# Patient Record
Sex: Female | Born: 1980 | Race: Black or African American | Hispanic: No | Marital: Single | Smoking: Never smoker
Health system: Southern US, Community
[De-identification: ages and names within clinical notes are randomized; demographics above are authoritative.]

## PROBLEM LIST (undated history)

## (undated) ENCOUNTER — Ambulatory Visit: Admission: EM | Payer: Medicaid Other | Source: Home / Self Care

## (undated) ENCOUNTER — Inpatient Hospital Stay (HOSPITAL_COMMUNITY): Payer: Self-pay

## (undated) ENCOUNTER — Ambulatory Visit: Admission: EM | Source: Home / Self Care

## (undated) DIAGNOSIS — F419 Anxiety disorder, unspecified: Secondary | ICD-10-CM

## (undated) DIAGNOSIS — G4700 Insomnia, unspecified: Secondary | ICD-10-CM

## (undated) DIAGNOSIS — E739 Lactose intolerance, unspecified: Secondary | ICD-10-CM

## (undated) DIAGNOSIS — K222 Esophageal obstruction: Secondary | ICD-10-CM

## (undated) DIAGNOSIS — J40 Bronchitis, not specified as acute or chronic: Secondary | ICD-10-CM

## (undated) DIAGNOSIS — D649 Anemia, unspecified: Secondary | ICD-10-CM

## (undated) DIAGNOSIS — G4730 Sleep apnea, unspecified: Secondary | ICD-10-CM

## (undated) DIAGNOSIS — T7840XA Allergy, unspecified, initial encounter: Secondary | ICD-10-CM

## (undated) DIAGNOSIS — K219 Gastro-esophageal reflux disease without esophagitis: Secondary | ICD-10-CM

## (undated) DIAGNOSIS — F32A Depression, unspecified: Secondary | ICD-10-CM

## (undated) DIAGNOSIS — R519 Headache, unspecified: Secondary | ICD-10-CM

## (undated) DIAGNOSIS — B974 Respiratory syncytial virus as the cause of diseases classified elsewhere: Secondary | ICD-10-CM

## (undated) DIAGNOSIS — K449 Diaphragmatic hernia without obstruction or gangrene: Secondary | ICD-10-CM

## (undated) DIAGNOSIS — M1990 Unspecified osteoarthritis, unspecified site: Secondary | ICD-10-CM

## (undated) DIAGNOSIS — B338 Other specified viral diseases: Secondary | ICD-10-CM

## (undated) DIAGNOSIS — R51 Headache: Secondary | ICD-10-CM

## (undated) DIAGNOSIS — J45909 Unspecified asthma, uncomplicated: Secondary | ICD-10-CM

## (undated) HISTORY — DX: Depression, unspecified: F32.A

## (undated) HISTORY — PX: TONSILLECTOMY: SUR1361

## (undated) HISTORY — PX: WISDOM TOOTH EXTRACTION: SHX21

## (undated) HISTORY — DX: Other specified viral diseases: B33.8

## (undated) HISTORY — DX: Anemia, unspecified: D64.9

## (undated) HISTORY — DX: Esophageal obstruction: K22.2

## (undated) HISTORY — DX: Sleep apnea, unspecified: G47.30

## (undated) HISTORY — DX: Respiratory syncytial virus as the cause of diseases classified elsewhere: B97.4

## (undated) HISTORY — DX: Allergy, unspecified, initial encounter: T78.40XA

## (undated) HISTORY — DX: Unspecified osteoarthritis, unspecified site: M19.90

## (undated) HISTORY — DX: Anxiety disorder, unspecified: F41.9

## (undated) HISTORY — DX: Insomnia, unspecified: G47.00

## (undated) HISTORY — DX: Diaphragmatic hernia without obstruction or gangrene: K44.9

## (undated) HISTORY — DX: Unspecified asthma, uncomplicated: J45.909

---
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
## 2001-01-12 ENCOUNTER — Encounter: Admission: RE | Admit: 2001-01-12 | Discharge: 2001-01-12 | Payer: Self-pay | Admitting: Family Medicine

## 2001-01-12 ENCOUNTER — Other Ambulatory Visit: Admission: RE | Admit: 2001-01-12 | Discharge: 2001-01-12 | Payer: Self-pay | Admitting: Family Medicine

## 2001-01-25 ENCOUNTER — Encounter: Admission: RE | Admit: 2001-01-25 | Discharge: 2001-01-25 | Payer: Self-pay | Admitting: Family Medicine

## 2001-08-19 ENCOUNTER — Encounter: Admission: RE | Admit: 2001-08-19 | Discharge: 2001-08-19 | Payer: Self-pay | Admitting: Family Medicine

## 2001-10-18 ENCOUNTER — Encounter: Admission: RE | Admit: 2001-10-18 | Discharge: 2001-10-18 | Payer: Self-pay | Admitting: Family Medicine

## 2001-10-28 ENCOUNTER — Encounter: Admission: RE | Admit: 2001-10-28 | Discharge: 2001-10-28 | Payer: Self-pay | Admitting: Family Medicine

## 2001-11-18 ENCOUNTER — Encounter: Admission: RE | Admit: 2001-11-18 | Discharge: 2001-11-18 | Payer: Self-pay | Admitting: Family Medicine

## 2002-05-31 ENCOUNTER — Other Ambulatory Visit: Admission: RE | Admit: 2002-05-31 | Discharge: 2002-05-31 | Payer: Self-pay | Admitting: Family Medicine

## 2002-05-31 ENCOUNTER — Encounter: Admission: RE | Admit: 2002-05-31 | Discharge: 2002-05-31 | Payer: Self-pay | Admitting: Family Medicine

## 2002-06-02 ENCOUNTER — Encounter: Admission: RE | Admit: 2002-06-02 | Discharge: 2002-06-02 | Payer: Self-pay | Admitting: Family Medicine

## 2002-06-10 ENCOUNTER — Encounter: Admission: RE | Admit: 2002-06-10 | Discharge: 2002-06-10 | Payer: Self-pay | Admitting: Family Medicine

## 2002-07-27 ENCOUNTER — Encounter: Admission: RE | Admit: 2002-07-27 | Discharge: 2002-07-27 | Payer: Self-pay | Admitting: Family Medicine

## 2002-11-05 ENCOUNTER — Inpatient Hospital Stay (HOSPITAL_COMMUNITY): Admission: AD | Admit: 2002-11-05 | Discharge: 2002-11-05 | Payer: Self-pay | Admitting: *Deleted

## 2003-01-01 ENCOUNTER — Emergency Department (HOSPITAL_COMMUNITY): Admission: EM | Admit: 2003-01-01 | Discharge: 2003-01-01 | Payer: Self-pay | Admitting: Emergency Medicine

## 2003-05-04 ENCOUNTER — Emergency Department (HOSPITAL_COMMUNITY): Admission: EM | Admit: 2003-05-04 | Discharge: 2003-05-04 | Payer: Self-pay | Admitting: Emergency Medicine

## 2003-06-04 ENCOUNTER — Encounter (INDEPENDENT_AMBULATORY_CARE_PROVIDER_SITE_OTHER): Payer: Self-pay | Admitting: *Deleted

## 2003-06-18 ENCOUNTER — Inpatient Hospital Stay (HOSPITAL_COMMUNITY): Admission: AD | Admit: 2003-06-18 | Discharge: 2003-06-19 | Payer: Self-pay | Admitting: Family Medicine

## 2003-06-19 ENCOUNTER — Encounter: Payer: Self-pay | Admitting: Family Medicine

## 2003-06-22 ENCOUNTER — Other Ambulatory Visit: Admission: RE | Admit: 2003-06-22 | Discharge: 2003-06-22 | Payer: Self-pay | Admitting: Family Medicine

## 2003-06-22 ENCOUNTER — Encounter: Admission: RE | Admit: 2003-06-22 | Discharge: 2003-06-22 | Payer: Self-pay | Admitting: Family Medicine

## 2003-07-05 ENCOUNTER — Encounter: Payer: Self-pay | Admitting: Family Medicine

## 2003-07-05 ENCOUNTER — Ambulatory Visit (HOSPITAL_COMMUNITY): Admission: RE | Admit: 2003-07-05 | Discharge: 2003-07-05 | Payer: Self-pay | Admitting: Family Medicine

## 2003-07-25 ENCOUNTER — Encounter: Admission: RE | Admit: 2003-07-25 | Discharge: 2003-07-25 | Payer: Self-pay | Admitting: Family Medicine

## 2003-08-29 ENCOUNTER — Encounter: Admission: RE | Admit: 2003-08-29 | Discharge: 2003-08-29 | Payer: Self-pay | Admitting: Family Medicine

## 2003-09-02 ENCOUNTER — Inpatient Hospital Stay (HOSPITAL_COMMUNITY): Admission: AD | Admit: 2003-09-02 | Discharge: 2003-09-03 | Payer: Self-pay | Admitting: Obstetrics and Gynecology

## 2003-09-09 ENCOUNTER — Inpatient Hospital Stay (HOSPITAL_COMMUNITY): Admission: AD | Admit: 2003-09-09 | Discharge: 2003-09-09 | Payer: Self-pay | Admitting: Family Medicine

## 2003-09-22 ENCOUNTER — Ambulatory Visit (HOSPITAL_COMMUNITY): Admission: RE | Admit: 2003-09-22 | Discharge: 2003-09-22 | Payer: Self-pay | Admitting: Family Medicine

## 2003-09-27 ENCOUNTER — Encounter: Admission: RE | Admit: 2003-09-27 | Discharge: 2003-09-27 | Payer: Self-pay | Admitting: Family Medicine

## 2003-10-30 ENCOUNTER — Encounter: Admission: RE | Admit: 2003-10-30 | Discharge: 2003-10-30 | Payer: Self-pay | Admitting: Family Medicine

## 2003-11-13 ENCOUNTER — Inpatient Hospital Stay (HOSPITAL_COMMUNITY): Admission: AD | Admit: 2003-11-13 | Discharge: 2003-11-13 | Payer: Self-pay | Admitting: Family Medicine

## 2003-11-29 ENCOUNTER — Encounter: Admission: RE | Admit: 2003-11-29 | Discharge: 2003-11-29 | Payer: Self-pay | Admitting: Family Medicine

## 2003-12-01 ENCOUNTER — Encounter: Admission: RE | Admit: 2003-12-01 | Discharge: 2003-12-01 | Payer: Self-pay | Admitting: Sports Medicine

## 2003-12-08 ENCOUNTER — Encounter: Admission: RE | Admit: 2003-12-08 | Discharge: 2003-12-08 | Payer: Self-pay | Admitting: Family Medicine

## 2003-12-14 ENCOUNTER — Encounter: Admission: RE | Admit: 2003-12-14 | Discharge: 2003-12-14 | Payer: Self-pay | Admitting: Sports Medicine

## 2003-12-28 ENCOUNTER — Encounter: Admission: RE | Admit: 2003-12-28 | Discharge: 2003-12-28 | Payer: Self-pay | Admitting: Family Medicine

## 2003-12-30 ENCOUNTER — Inpatient Hospital Stay (HOSPITAL_COMMUNITY): Admission: AD | Admit: 2003-12-30 | Discharge: 2003-12-30 | Payer: Self-pay | Admitting: *Deleted

## 2004-01-12 ENCOUNTER — Encounter: Admission: RE | Admit: 2004-01-12 | Discharge: 2004-01-12 | Payer: Self-pay | Admitting: Family Medicine

## 2004-01-16 ENCOUNTER — Encounter: Admission: RE | Admit: 2004-01-16 | Discharge: 2004-01-16 | Payer: Self-pay | Admitting: Family Medicine

## 2004-01-24 ENCOUNTER — Encounter: Admission: RE | Admit: 2004-01-24 | Discharge: 2004-01-24 | Payer: Self-pay | Admitting: Family Medicine

## 2004-01-31 ENCOUNTER — Encounter: Admission: RE | Admit: 2004-01-31 | Discharge: 2004-01-31 | Payer: Self-pay | Admitting: Family Medicine

## 2004-02-07 ENCOUNTER — Inpatient Hospital Stay (HOSPITAL_COMMUNITY): Admission: AD | Admit: 2004-02-07 | Discharge: 2004-02-09 | Payer: Self-pay | Admitting: Family Medicine

## 2004-02-15 ENCOUNTER — Encounter: Admission: RE | Admit: 2004-02-15 | Discharge: 2004-02-15 | Payer: Self-pay | Admitting: Family Medicine

## 2004-03-21 ENCOUNTER — Encounter: Admission: RE | Admit: 2004-03-21 | Discharge: 2004-03-21 | Payer: Self-pay | Admitting: Sports Medicine

## 2004-05-15 ENCOUNTER — Encounter: Admission: RE | Admit: 2004-05-15 | Discharge: 2004-05-15 | Payer: Self-pay | Admitting: Family Medicine

## 2004-05-19 ENCOUNTER — Emergency Department (HOSPITAL_COMMUNITY): Admission: EM | Admit: 2004-05-19 | Discharge: 2004-05-19 | Payer: Self-pay | Admitting: *Deleted

## 2004-08-22 ENCOUNTER — Inpatient Hospital Stay (HOSPITAL_COMMUNITY): Admission: AD | Admit: 2004-08-22 | Discharge: 2004-08-22 | Payer: Self-pay | Admitting: Obstetrics & Gynecology

## 2004-09-04 ENCOUNTER — Ambulatory Visit: Payer: Self-pay | Admitting: Family Medicine

## 2004-09-04 ENCOUNTER — Other Ambulatory Visit: Admission: RE | Admit: 2004-09-04 | Discharge: 2004-09-04 | Payer: Self-pay | Admitting: Family Medicine

## 2004-09-11 ENCOUNTER — Ambulatory Visit: Payer: Self-pay | Admitting: Family Medicine

## 2004-10-29 ENCOUNTER — Ambulatory Visit: Payer: Self-pay | Admitting: Family Medicine

## 2004-11-01 ENCOUNTER — Encounter: Admission: RE | Admit: 2004-11-01 | Discharge: 2004-11-28 | Payer: Self-pay | Admitting: Family Medicine

## 2005-01-03 ENCOUNTER — Emergency Department (HOSPITAL_COMMUNITY): Admission: EM | Admit: 2005-01-03 | Discharge: 2005-01-03 | Payer: Self-pay | Admitting: Family Medicine

## 2005-01-29 ENCOUNTER — Ambulatory Visit: Payer: Self-pay | Admitting: Family Medicine

## 2005-03-04 ENCOUNTER — Ambulatory Visit: Payer: Self-pay | Admitting: Family Medicine

## 2005-03-19 ENCOUNTER — Ambulatory Visit: Payer: Self-pay | Admitting: Family Medicine

## 2005-05-14 ENCOUNTER — Ambulatory Visit: Payer: Self-pay | Admitting: Family Medicine

## 2005-05-28 ENCOUNTER — Ambulatory Visit: Payer: Self-pay | Admitting: Family Medicine

## 2005-05-29 ENCOUNTER — Emergency Department (HOSPITAL_COMMUNITY): Admission: EM | Admit: 2005-05-29 | Discharge: 2005-05-30 | Payer: Self-pay | Admitting: Emergency Medicine

## 2005-09-03 ENCOUNTER — Ambulatory Visit: Payer: Self-pay | Admitting: Family Medicine

## 2005-10-02 ENCOUNTER — Ambulatory Visit (HOSPITAL_BASED_OUTPATIENT_CLINIC_OR_DEPARTMENT_OTHER): Admission: RE | Admit: 2005-10-02 | Discharge: 2005-10-03 | Payer: Self-pay | Admitting: Otolaryngology

## 2005-10-02 ENCOUNTER — Ambulatory Visit (HOSPITAL_COMMUNITY): Admission: RE | Admit: 2005-10-02 | Discharge: 2005-10-02 | Payer: Self-pay | Admitting: Otolaryngology

## 2005-10-02 ENCOUNTER — Encounter (INDEPENDENT_AMBULATORY_CARE_PROVIDER_SITE_OTHER): Payer: Self-pay | Admitting: Specialist

## 2005-10-16 ENCOUNTER — Ambulatory Visit: Payer: Self-pay | Admitting: Sports Medicine

## 2005-10-26 ENCOUNTER — Emergency Department (HOSPITAL_COMMUNITY): Admission: EM | Admit: 2005-10-26 | Discharge: 2005-10-26 | Payer: Self-pay | Admitting: Emergency Medicine

## 2005-10-31 ENCOUNTER — Ambulatory Visit: Payer: Self-pay | Admitting: Sports Medicine

## 2005-11-13 ENCOUNTER — Ambulatory Visit: Payer: Self-pay | Admitting: Family Medicine

## 2006-04-03 ENCOUNTER — Ambulatory Visit: Payer: Self-pay | Admitting: Family Medicine

## 2006-04-06 ENCOUNTER — Other Ambulatory Visit: Admission: RE | Admit: 2006-04-06 | Discharge: 2006-04-06 | Payer: Self-pay | Admitting: Family Medicine

## 2006-04-06 ENCOUNTER — Ambulatory Visit: Payer: Self-pay | Admitting: Family Medicine

## 2006-04-13 ENCOUNTER — Ambulatory Visit: Payer: Self-pay | Admitting: Family Medicine

## 2006-06-15 ENCOUNTER — Ambulatory Visit: Payer: Self-pay | Admitting: Family Medicine

## 2006-07-15 ENCOUNTER — Ambulatory Visit: Payer: Self-pay | Admitting: Sports Medicine

## 2006-08-05 ENCOUNTER — Emergency Department (HOSPITAL_COMMUNITY): Admission: EM | Admit: 2006-08-05 | Discharge: 2006-08-05 | Payer: Self-pay | Admitting: Family Medicine

## 2006-08-19 ENCOUNTER — Ambulatory Visit: Payer: Self-pay | Admitting: Family Medicine

## 2006-09-17 ENCOUNTER — Ambulatory Visit: Payer: Self-pay | Admitting: Sports Medicine

## 2006-12-31 DIAGNOSIS — K219 Gastro-esophageal reflux disease without esophagitis: Secondary | ICD-10-CM | POA: Insufficient documentation

## 2006-12-31 DIAGNOSIS — J309 Allergic rhinitis, unspecified: Secondary | ICD-10-CM | POA: Insufficient documentation

## 2006-12-31 DIAGNOSIS — G43909 Migraine, unspecified, not intractable, without status migrainosus: Secondary | ICD-10-CM | POA: Insufficient documentation

## 2006-12-31 DIAGNOSIS — E669 Obesity, unspecified: Secondary | ICD-10-CM | POA: Insufficient documentation

## 2007-01-01 ENCOUNTER — Encounter (INDEPENDENT_AMBULATORY_CARE_PROVIDER_SITE_OTHER): Payer: Self-pay | Admitting: *Deleted

## 2007-02-20 ENCOUNTER — Emergency Department (HOSPITAL_COMMUNITY): Admission: EM | Admit: 2007-02-20 | Discharge: 2007-02-20 | Payer: Self-pay | Admitting: Emergency Medicine

## 2008-06-23 ENCOUNTER — Encounter: Payer: Self-pay | Admitting: *Deleted

## 2008-06-30 ENCOUNTER — Encounter: Payer: Self-pay | Admitting: *Deleted

## 2008-11-21 ENCOUNTER — Emergency Department (HOSPITAL_COMMUNITY): Admission: EM | Admit: 2008-11-21 | Discharge: 2008-11-21 | Payer: Self-pay | Admitting: Family Medicine

## 2009-01-29 ENCOUNTER — Emergency Department (HOSPITAL_COMMUNITY): Admission: EM | Admit: 2009-01-29 | Discharge: 2009-01-29 | Payer: Self-pay | Admitting: Family Medicine

## 2009-01-29 ENCOUNTER — Encounter: Admission: RE | Admit: 2009-01-29 | Discharge: 2009-01-29 | Payer: Self-pay | Admitting: Internal Medicine

## 2009-07-21 ENCOUNTER — Emergency Department (HOSPITAL_COMMUNITY): Admission: EM | Admit: 2009-07-21 | Discharge: 2009-07-21 | Payer: Self-pay | Admitting: Emergency Medicine

## 2009-10-21 ENCOUNTER — Emergency Department (HOSPITAL_COMMUNITY): Admission: EM | Admit: 2009-10-21 | Discharge: 2009-10-22 | Payer: Self-pay | Admitting: Emergency Medicine

## 2011-03-21 NOTE — Op Note (Signed)
[name], [name] NO.:  [id number]   MEDICAL RECORD NO.:  [id number]            PATIENT TYPE:   LOCATION:                                 FACILITY:   PHYSICIAN:  Suzanna Obey, M.D.            DATE OF BIRTH:   DATE OF PROCEDURE:  10/02/2005  DATE OF DISCHARGE:                                 OPERATIVE REPORT   PREOPERATIVE DIAGNOSIS:  Chronic tonsillitis.   POSTOPERATIVE DIAGNOSIS:  Chronic tonsillitis.   OPERATION/PROCEDURE:  Tonsillectomy.   ANESTHESIA:  General endotracheal anesthesia.   ESTIMATED BLOOD LOSS:  Less than 5 cc.   INDICATIONS:  This is a 30 year old who has had chronic and recurrent  episodes of tonsillitis with cryptic debris.  The patient is now ready to  proceed as she has failed medical therapy.  He was informed of the risk and  benefits of the procedure including bleeding, infection, palatopharyngeal  insufficiency, change in the voice, chronic pain, and risks of the  anesthetic.  All questions were answered and consent was obtained.   DESCRIPTION OF PROCEDURE:  The patient was taken to the operating room and  placed in the supine position after adequate general endotracheal tube  anesthesia, was placed in the Rose position, draped in the usual sterile  manner.  Crowe-Davis mouth gag was inserted and retracted and suspended from  the [employer] stand.  Left tonsil began by making a left anterior tonsillar  pillar incision, identifying the capsule of the tonsil and removing it with  electrocautery dissection.  The right tonsil was removed in the same  fashion.  The tonsils were then cauterized with suction cautery to gain good  hemostasis.  The adenoids were examined. There was no significant adenoid  tissue.  The hypopharynx, esophagus, and stomach were suctioned of the NG  tube. The Crowe-Davis was released and resuspended.  There was hemostasis  present.  The patient was awakened and brought to the recovery room in  stable condition.   Counts correct.           ______________________________  Suzanna Obey, M.D.     JB/MEDQ  D:  10/02/2005  T:  10/02/2005  Job:  [id number]

## 2011-08-08 ENCOUNTER — Emergency Department (HOSPITAL_BASED_OUTPATIENT_CLINIC_OR_DEPARTMENT_OTHER)
Admission: EM | Admit: 2011-08-08 | Discharge: 2011-08-09 | Disposition: A | Discharge status: Home / Self Care | Payer: Medicaid Other | Attending: Emergency Medicine | Admitting: Emergency Medicine

## 2011-08-08 DIAGNOSIS — K219 Gastro-esophageal reflux disease without esophagitis: Secondary | ICD-10-CM | POA: Insufficient documentation

## 2011-08-08 DIAGNOSIS — Z79899 Other long term (current) drug therapy: Secondary | ICD-10-CM | POA: Insufficient documentation

## 2011-08-08 DIAGNOSIS — G43909 Migraine, unspecified, not intractable, without status migrainosus: Secondary | ICD-10-CM | POA: Insufficient documentation

## 2011-08-08 HISTORY — DX: Gastro-esophageal reflux disease without esophagitis: K21.9

## 2011-08-08 MED ORDER — METOCLOPRAMIDE HCL 5 MG/ML IJ SOLN
10.0000 mg | Freq: Once | INTRAMUSCULAR | Status: AC
  Administered 2011-08-08: 10 mg via INTRAVENOUS
  Filled 2011-08-08: qty 2

## 2011-08-08 MED ORDER — ONDANSETRON HCL 4 MG/2ML IJ SOLN
INTRAMUSCULAR | Status: AC
  Filled 2011-08-08: qty 2

## 2011-08-08 MED ORDER — KETOROLAC TROMETHAMINE 30 MG/ML IJ SOLN
30.0000 mg | Freq: Once | INTRAMUSCULAR | Status: AC
  Administered 2011-08-08: 30 mg via INTRAVENOUS
  Filled 2011-08-08: qty 1

## 2011-08-08 MED ORDER — SODIUM CHLORIDE 0.9 % IV BOLUS (SEPSIS)
1000.0000 mL | Freq: Once | INTRAVENOUS | Status: AC
  Administered 2011-08-08: 1000 mL via INTRAVENOUS

## 2011-08-08 MED ORDER — DIPHENHYDRAMINE HCL 50 MG/ML IJ SOLN
25.0000 mg | Freq: Once | INTRAMUSCULAR | Status: AC
  Administered 2011-08-08: 25 mg via INTRAVENOUS
  Filled 2011-08-08: qty 1

## 2011-08-08 MED ORDER — ONDANSETRON HCL 4 MG/2ML IJ SOLN
4.0000 mg | Freq: Once | INTRAMUSCULAR | Status: AC
  Administered 2011-08-08: 4 mg via INTRAVENOUS

## 2011-08-08 NOTE — ED Provider Notes (Signed)
History     CSN: [id number] Arrival date & time: 08/08/2011 10:35 PM  Chief Complaint  Patient presents with  . Migraine    (Consider location/radiation/quality/duration/timing/severity/associated sxs/prior treatment) HPI Comments: Patient is a 30 year old female with history of migraine disorder. She states that her headache, gradually in the afternoon it gradually got worse. It is constant, frontal, throbbing in nature. She has associated nausea and photophobia. She states this feels similar to her prior headaches. She denies focal neurologic deficits including weakness, numbness, visual change.  Patient is a 30 y.o. female presenting with migraine. The history is provided by the patient and a relative.  Migraine    Past Medical History  Diagnosis Date  . Migraine   . GERD (gastroesophageal reflux disease)     Past Surgical History  Procedure Date  . Tonsillectomy   . Wisdom tooth extraction     No family history on file.  History  Substance Use Topics  . Smoking status: Never Smoker   . Smokeless tobacco: Not on file  . Alcohol Use: Yes    OB History    Grav Para Term Preterm Abortions TAB SAB Ect Mult Living                  Review of Systems  All other systems reviewed and are negative.    Allergies  Amoxil  Home Medications   Current Outpatient Rx  Name Route Sig Dispense Refill  . ALBUTEROL SULFATE HFA 108 (90 BASE) MCG/ACT IN AERS Inhalation Inhale 2 puffs into the lungs every 6 (six) hours as needed. For shortness of breath and wheezing     . CETIRIZINE HCL 10 MG PO TABS Oral Take 10 mg by mouth daily.      . CYCLOBENZAPRINE HCL PO Oral Take by mouth.      . ETONOGESTREL-ETHINYL ESTRADIOL 0.12-0.015 MG/24HR VA RING Vaginal Place 1 each vaginally every 28 (twenty-eight) days. Insert vaginally and leave in place for 3 consecutive weeks, then remove for 1 week.     Marland Kitchen FLUTICASONE PROPIONATE 50 MCG/ACT NA SUSP Nasal Place 2 sprays into the nose daily  as needed. Seasonal allergies      . ADVAIR HFA IN Inhalation Inhale into the lungs.      . IBUPROFEN 800 MG PO TABS Oral Take 800 mg by mouth every 8 (eight) hours as needed. For pain    . PRILOSEC PO Oral Take by mouth.      Marland Kitchen ZOLOFT PO Oral Take by mouth.      . SUMATRIPTAN SUCCINATE 25 MG PO TABS Oral Take 25 mg by mouth every 2 (two) hours as needed. For migraine    . TRAMADOL HCL PO Oral Take by mouth.        BP 129/81  Pulse 85  Temp(Src) 98.4 F (36.9 C) (Oral)  Resp 20  Ht 5' 1.75" (1.568 m)  Wt 227 lb (102.967 kg)  BMI 41.86 kg/m2  SpO2 100%  LMP 07/09/2011  Physical Exam  Nursing note and vitals reviewed. Constitutional: She appears well-developed and well-nourished. No distress.  HENT:  Head: Normocephalic and atraumatic.  Mouth/Throat: Oropharynx is clear and moist. No oropharyngeal exudate.  Eyes: Conjunctivae and EOM are normal. Pupils are equal, round, and reactive to light. Right eye exhibits no discharge. Left eye exhibits no discharge. No scleral icterus.  Neck: Normal range of motion. Neck supple. No JVD present. No thyromegaly present.  Cardiovascular: Normal rate, regular rhythm, normal heart sounds and intact distal pulses.  Exam reveals no gallop and no friction rub.   No murmur heard. Pulmonary/Chest: Effort normal and breath sounds normal. No respiratory distress. She has no wheezes. She has no rales.  Abdominal: Soft. Bowel sounds are normal. She exhibits no distension and no mass. There is no tenderness.  Musculoskeletal: Normal range of motion. She exhibits no edema and no tenderness.  Lymphadenopathy:    She has no cervical adenopathy.  Neurological: She is alert. Coordination normal.       Speech normal, pupillary exam normal, alert and oriented, gait normal, follows all commands. Normal muscle tone sensations to the upper lower extremity  Skin: Skin is warm and dry. No rash noted. No erythema.  Psychiatric: She has a normal mood and affect. Her  behavior is normal.    ED Course  Procedures (including critical care time)  Labs Reviewed - No data to display No results found.   No diagnosis found.    MDM  Examined history consistent with her typical migraine. I be placed by nursing staff, medications ordered. Will reevaluate her no signs of meningitis including fever or stiff neck, normal mental status no focal neurologic deficits with a headache that feels like her normal headache.  After medications given through intravenous access, patient improved to have a 0/10 pain. We'll discharge home      Vida Roller, MD 08/09/11 [phone]

## 2011-08-08 NOTE — ED Notes (Signed)
C/o migraine,nausea

## 2011-08-09 MED ORDER — METOCLOPRAMIDE HCL 10 MG PO TABS: 10.0000 mg | ORAL_TABLET | Freq: Four times a day (QID) | ORAL | Status: AC

## 2012-05-19 ENCOUNTER — Emergency Department (HOSPITAL_COMMUNITY)
Admission: EM | Admit: 2012-05-19 | Discharge: 2012-05-19 | Disposition: A | Discharge status: Home / Self Care | Payer: Medicaid Other | Attending: Emergency Medicine | Admitting: Emergency Medicine

## 2012-05-19 ENCOUNTER — Encounter (HOSPITAL_COMMUNITY): Payer: Self-pay | Admitting: *Deleted

## 2012-05-19 DIAGNOSIS — G43909 Migraine, unspecified, not intractable, without status migrainosus: Secondary | ICD-10-CM | POA: Insufficient documentation

## 2012-05-19 DIAGNOSIS — H109 Unspecified conjunctivitis: Secondary | ICD-10-CM

## 2012-05-19 DIAGNOSIS — K219 Gastro-esophageal reflux disease without esophagitis: Secondary | ICD-10-CM | POA: Insufficient documentation

## 2012-05-19 MED ORDER — TETRACAINE HCL 0.5 % OP SOLN
2.0000 [drp] | Freq: Once | OPHTHALMIC | Status: DC
  Filled 2012-05-19: qty 2

## 2012-05-19 MED ORDER — ERYTHROMYCIN 5 MG/GM OP OINT: TOPICAL_OINTMENT | Freq: Four times a day (QID) | OPHTHALMIC | Status: AC

## 2012-05-19 MED ORDER — ERYTHROMYCIN 5 MG/GM OP OINT
TOPICAL_OINTMENT | Freq: Once | OPHTHALMIC | Status: DC
  Filled 2012-05-19: qty 1

## 2012-05-19 MED ORDER — FLUORESCEIN SODIUM 1 MG OP STRP
1.0000 | ORAL_STRIP | Freq: Once | OPHTHALMIC | Status: DC
  Filled 2012-05-19: qty 2

## 2012-05-19 NOTE — ED Notes (Signed)
Red eye irritation

## 2012-05-19 NOTE — ED Provider Notes (Signed)
History     CSN: [id number]  Arrival date & time 05/19/12  2129   None     Chief Complaint  Patient presents with  . Conjunctivitis    (Consider location/radiation/quality/duration/timing/severity/associated sxs/prior treatment) HPI Comments: Patient noticed yesterday that she had some slight I., redness, and irritation with swelling to the upper lid.  This morning.  She woke and this was worse.  I has been tearing throughout the day.  She does not wear contact lenses.  She does not remember hitting anything in her eye.  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  She noticed since her eye has been red, but she also has a slight sore throat.  On the same side.  She has been taking ibuprofen with minimal relief.  She did call her primary care physician, but they were unable to see her today  Patient is a 31 y.o. female presenting with conjunctivitis. The history is provided by the patient.  Conjunctivitis  The current episode started yesterday. The problem occurs continuously. The problem has been unchanged. The problem is moderate. Nothing relieves the symptoms. Nothing aggravates the symptoms. Associated symptoms include sore throat, eye discharge, eye pain and eye redness. Pertinent negatives include no fever, no decreased vision, no double vision, no eye itching, no photophobia, no ear discharge, no ear pain, no hearing loss and no rhinorrhea.    Past Medical History  Diagnosis Date  . Migraine   . GERD (gastroesophageal reflux disease)     Past Surgical History  Procedure Date  . Tonsillectomy   . Wisdom tooth extraction     History reviewed. No pertinent family history.  History  Substance Use Topics  . Smoking status: Never Smoker   . Smokeless tobacco: Not on file  . Alcohol Use: Yes    OB History    Grav Para Term Preterm Abortions TAB SAB Ect Mult Living                  Review of Systems  Constitutional: Negative for fever.   HENT: Positive for sore throat. Negative for hearing loss, ear pain, rhinorrhea and ear discharge.   Eyes: Positive for pain, discharge and redness. Negative for double vision, photophobia, itching and visual disturbance.  Neurological: Negative for dizziness and weakness.    Allergies  Amoxicillin  Home Medications   Current Outpatient Rx  Name Route Sig Dispense Refill  . ALBUTEROL SULFATE HFA 108 (90 BASE) MCG/ACT IN AERS Inhalation Inhale 2 puffs into the lungs every 6 (six) hours as needed. For shortness of breath and wheezing     . CETIRIZINE HCL 10 MG PO TABS Oral Take 10 mg by mouth daily.      . CYCLOBENZAPRINE HCL 10 MG PO TABS Oral Take 10 mg by mouth 3 (three) times daily as needed. For pain    . ETONOGESTREL-ETHINYL ESTRADIOL 0.12-0.015 MG/24HR VA RING Vaginal Place 1 each vaginally every 28 (twenty-eight) days. Insert vaginally and leave in place for 3 consecutive weeks, then remove for 1 week.     Marland Kitchen FLUTICASONE PROPIONATE 50 MCG/ACT NA SUSP Nasal Place 2 sprays into the nose daily as needed. Seasonal allergies      . FLUTICASONE-SALMETEROL 250-50 MCG/DOSE IN AEPB Inhalation Inhale 1 puff into the lungs every 12 (twelve) hours.    . IBUPROFEN 800 MG PO TABS Oral Take 800 mg by mouth every 8 (eight) hours as needed. For pain    .  NYSTATIN 100000 UNIT/GM EX OINT Topical Apply 1 application topically 3 (three) times daily.    Marland Kitchen PANTOPRAZOLE SODIUM 40 MG PO TBEC Oral Take 40 mg by mouth daily.    Marland Kitchen ZOLOFT PO Oral Take 50 mg by mouth daily.       BP 129/80  Pulse 93  Temp 99 F (37.2 C) (Oral)  Resp 20  SpO2 99%  Physical Exam  Constitutional: She appears well-developed and well-nourished.  HENT:  Head: Normocephalic.  Mouth/Throat: Uvula is midline. No oropharyngeal exudate, posterior oropharyngeal erythema or tonsillar abscesses.  Eyes: Pupils are equal, round, and reactive to light. Right eye exhibits discharge. Right conjunctiva is injected. Right conjunctiva has  no hemorrhage. Right eye exhibits normal extraocular motion and no nystagmus.  Slit lamp exam:      The right eye shows no fluorescein uptake and no anterior chamber bulge.       Some light swelling of the upper lid, lateral aspect greater than medial    ED Course  Procedures (including critical care time)  Labs Reviewed - No data to display No results found.   1. Conjunctivitis       MDM   There was no fluorescein uptake.  On examination, I will treat this patient for conjunctivitis with erythromycin ointment 4 times a day.  Warm soaks, and followup with ophthalmologist in 2 days        Arman Filter, NP 05/19/12 2303  Arman Filter, NP 05/19/12 2303

## 2012-05-20 NOTE — ED Provider Notes (Signed)
Medical screening examination/treatment/procedure(s) were performed by non-physician practitioner and as supervising physician I was immediately available for consultation/collaboration.   Kavin Weckwerth, MD 05/20/12 0007 

## 2012-08-11 ENCOUNTER — Encounter (HOSPITAL_BASED_OUTPATIENT_CLINIC_OR_DEPARTMENT_OTHER): Payer: Self-pay | Admitting: *Deleted

## 2012-08-11 ENCOUNTER — Emergency Department (HOSPITAL_BASED_OUTPATIENT_CLINIC_OR_DEPARTMENT_OTHER)
Admission: EM | Admit: 2012-08-11 | Discharge: 2012-08-11 | Disposition: A | Discharge status: Home / Self Care | Payer: Self-pay | Attending: Emergency Medicine | Admitting: Emergency Medicine

## 2012-08-11 DIAGNOSIS — K219 Gastro-esophageal reflux disease without esophagitis: Secondary | ICD-10-CM | POA: Insufficient documentation

## 2012-08-11 DIAGNOSIS — G43909 Migraine, unspecified, not intractable, without status migrainosus: Secondary | ICD-10-CM | POA: Insufficient documentation

## 2012-08-11 DIAGNOSIS — Z881 Allergy status to other antibiotic agents status: Secondary | ICD-10-CM | POA: Insufficient documentation

## 2012-08-11 DIAGNOSIS — N644 Mastodynia: Secondary | ICD-10-CM | POA: Insufficient documentation

## 2012-08-11 MED ORDER — DICLOFENAC SODIUM 75 MG PO TBEC: 75.0000 mg | DELAYED_RELEASE_TABLET | Freq: Two times a day (BID) | ORAL | Status: DC

## 2012-08-11 NOTE — ED Provider Notes (Signed)
Medical screening examination/treatment/procedure(s) were performed by non-physician practitioner and as supervising physician I was immediately available for consultation/collaboration.    Nelia Shi, MD 08/11/12 [phone]

## 2012-08-11 NOTE — ED Provider Notes (Signed)
History     CSN: [id number]  Arrival date & time 08/11/12  1432   First MD Initiated Contact with Patient 08/11/12 1520      Chief Complaint  Patient presents with  . Breast Pain    (Consider location/radiation/quality/duration/timing/severity/associated sxs/prior treatment) Patient is a 31 y.o. female presenting with chest pain. The history is provided by the patient. No language interpreter was used.  Chest Pain The chest pain began 1 - 2 weeks ago. Chest pain occurs constantly. The chest pain is unchanged.   Pt complains of a sore area to left breast.   No injury.   Pt has a skin bump in the same area.  Pt reports she thinks she felt a lump in the area  Past Medical History  Diagnosis Date  . Migraine   . GERD (gastroesophageal reflux disease)     Past Surgical History  Procedure Date  . Tonsillectomy   . Wisdom tooth extraction     History reviewed. No pertinent family history.  History  Substance Use Topics  . Smoking status: Never Smoker   . Smokeless tobacco: Not on file  . Alcohol Use: Yes    OB History    Grav Para Term Preterm Abortions TAB SAB Ect Mult Living                  Review of Systems  Cardiovascular: Positive for chest pain.  All other systems reviewed and are negative.    Allergies  Amoxicillin  Home Medications   Current Outpatient Rx  Name Route Sig Dispense Refill  . ALBUTEROL SULFATE HFA 108 (90 BASE) MCG/ACT IN AERS Inhalation Inhale 2 puffs into the lungs every 6 (six) hours as needed. For shortness of breath and wheezing     . CETIRIZINE HCL 10 MG PO TABS Oral Take 10 mg by mouth daily.      . CYCLOBENZAPRINE HCL 10 MG PO TABS Oral Take 10 mg by mouth 3 (three) times daily as needed. For pain    . DICLOFENAC SODIUM 75 MG PO TBEC Oral Take 1 tablet (75 mg total) by mouth 2 (two) times daily. 14 tablet 0  . ETONOGESTREL-ETHINYL ESTRADIOL 0.12-0.015 MG/24HR VA RING Vaginal Place 1 each vaginally every 28 (twenty-eight) days.  Insert vaginally and leave in place for 3 consecutive weeks, then remove for 1 week.     Marland Kitchen FLUTICASONE PROPIONATE 50 MCG/ACT NA SUSP Nasal Place 2 sprays into the nose daily as needed. Seasonal allergies      . FLUTICASONE-SALMETEROL 250-50 MCG/DOSE IN AEPB Inhalation Inhale 1 puff into the lungs every 12 (twelve) hours.    . IBUPROFEN 800 MG PO TABS Oral Take 800 mg by mouth every 8 (eight) hours as needed. For pain    . NYSTATIN 100000 UNIT/GM EX OINT Topical Apply 1 application topically 3 (three) times daily.    Marland Kitchen PANTOPRAZOLE SODIUM 40 MG PO TBEC Oral Take 40 mg by mouth daily.    Marland Kitchen ZOLOFT PO Oral Take 50 mg by mouth daily.       BP 123/77  Pulse 81  Temp 98 F (36.7 C) (Oral)  Resp 18  Ht 5' 1.75" (1.568 m)  Wt 255 lb 5 oz (115.809 kg)  BMI 47.08 kg/m2  SpO2 100%  LMP 08/03/2012  Physical Exam  Nursing note and vitals reviewed. Constitutional: She appears well-developed and well-nourished.  HENT:  Head: Normocephalic.  Cardiovascular: Normal rate.   Pulmonary/Chest: Effort normal.  Musculoskeletal: Normal range of motion.  I do not palpate any mass or swollen area.   Pt does have tenderness to palpation  Neurological: She is alert.  Skin: Skin is warm.  Psychiatric: She has a normal mood and affect.    ED Course  Procedures (including critical care time)  Labs Reviewed - No data to display No results found.   1. Soreness breast       MDM  I advised pt that I could put her on voltaren for soreness.  Pt advised to follow up at the breast center for ultrasound of area.          Lonia Skinner [place], [place] 08/11/12 1622

## 2012-08-11 NOTE — ED Notes (Signed)
Pt amb to triage with quick steady gait smiling in nad, reports one week of left breast pain and soreness. Tender to palp, increases with mvt of left arm, and positioning. Pt states she has a "bump" that her pcp looked at "he said it was nothing".

## 2012-11-03 HISTORY — PX: ESOPHAGOGASTRODUODENOSCOPY: SHX1529

## 2012-12-05 ENCOUNTER — Encounter (HOSPITAL_BASED_OUTPATIENT_CLINIC_OR_DEPARTMENT_OTHER): Payer: Self-pay | Admitting: *Deleted

## 2012-12-05 ENCOUNTER — Emergency Department (HOSPITAL_BASED_OUTPATIENT_CLINIC_OR_DEPARTMENT_OTHER)
Admission: EM | Admit: 2012-12-05 | Discharge: 2012-12-05 | Disposition: A | Discharge status: Home / Self Care | Payer: Medicaid Other | Attending: Emergency Medicine | Admitting: Emergency Medicine

## 2012-12-05 DIAGNOSIS — K219 Gastro-esophageal reflux disease without esophagitis: Secondary | ICD-10-CM | POA: Insufficient documentation

## 2012-12-05 DIAGNOSIS — J42 Unspecified chronic bronchitis: Secondary | ICD-10-CM | POA: Insufficient documentation

## 2012-12-05 DIAGNOSIS — R062 Wheezing: Secondary | ICD-10-CM | POA: Insufficient documentation

## 2012-12-05 DIAGNOSIS — J40 Bronchitis, not specified as acute or chronic: Secondary | ICD-10-CM

## 2012-12-05 DIAGNOSIS — Z8679 Personal history of other diseases of the circulatory system: Secondary | ICD-10-CM | POA: Insufficient documentation

## 2012-12-05 DIAGNOSIS — Z79899 Other long term (current) drug therapy: Secondary | ICD-10-CM | POA: Insufficient documentation

## 2012-12-05 DIAGNOSIS — J45909 Unspecified asthma, uncomplicated: Secondary | ICD-10-CM | POA: Insufficient documentation

## 2012-12-05 MED ORDER — BENZONATATE 100 MG PO CAPS: 100.0000 mg | ORAL_CAPSULE | Freq: Three times a day (TID) | ORAL | Status: DC

## 2012-12-05 MED ORDER — FLUTICASONE PROPIONATE 50 MCG/ACT NA SUSP: 2.0000 | Freq: Every day | NASAL | Status: DC

## 2012-12-05 MED ORDER — PREDNISONE 20 MG PO TABS: 60.0000 mg | ORAL_TABLET | Freq: Every day | ORAL | Status: DC

## 2012-12-05 MED ORDER — PREDNISONE 50 MG PO TABS
60.0000 mg | ORAL_TABLET | Freq: Once | ORAL | Status: AC
  Administered 2012-12-05: 60 mg via ORAL
  Filled 2012-12-05: qty 1

## 2012-12-05 MED ORDER — PREDNISONE 10 MG PO TABS
ORAL_TABLET | ORAL | Status: AC
  Filled 2012-12-05: qty 1

## 2012-12-05 MED ORDER — AZITHROMYCIN 250 MG PO TABS
500.0000 mg | ORAL_TABLET | Freq: Once | ORAL | Status: AC
  Administered 2012-12-05: 500 mg via ORAL
  Filled 2012-12-05: qty 2

## 2012-12-05 MED ORDER — AZITHROMYCIN 250 MG PO TABS: 250.0000 mg | ORAL_TABLET | Freq: Every day | ORAL | Status: DC

## 2012-12-05 MED ORDER — FLUTICASONE-SALMETEROL 250-50 MCG/DOSE IN AEPB: 1.0000 | INHALATION_SPRAY | Freq: Two times a day (BID) | RESPIRATORY_TRACT | Status: DC

## 2012-12-05 MED ORDER — AZITHROMYCIN 250 MG PO TABS
ORAL_TABLET | ORAL | Status: AC
  Filled 2012-12-05: qty 1

## 2012-12-05 MED ORDER — BENZONATATE 100 MG PO CAPS
200.0000 mg | ORAL_CAPSULE | Freq: Once | ORAL | Status: AC
  Administered 2012-12-05: 200 mg via ORAL
  Filled 2012-12-05: qty 2

## 2012-12-05 NOTE — ED Notes (Signed)
Pt. States cough  with wheezing times one week. Denies fevers. States non productive cough. Hx of asthma and took a breathing treatment prior to arrival.

## 2012-12-05 NOTE — ED Provider Notes (Signed)
History     CSN: [id number]  Arrival date & time 12/05/12  0444   First MD Initiated Contact with Patient 12/05/12 [phone]      Chief Complaint  Patient presents with  . Cough    (Consider location/radiation/quality/duration/timing/severity/associated sxs/prior treatment) HPI Pt with long history of asthma and frequent respiratory infections reports she has had dry cough and wheezing for about a week, improved some with nebs at home. No fever. Similar to numerous previous episodes typically resolved with Z-pak. She is also out of her flonase and Advair at home.   Past Medical History  Diagnosis Date  . Migraine   . GERD (gastroesophageal reflux disease)     Past Surgical History  Procedure Date  . Tonsillectomy   . Wisdom tooth extraction     No family history on file.  History  Substance Use Topics  . Smoking status: Never Smoker   . Smokeless tobacco: Not on file  . Alcohol Use: Yes    OB History    Grav Para Term Preterm Abortions TAB SAB Ect Mult Living                  Review of Systems All other systems reviewed and are negative except as noted in HPI.   Allergies  Amoxicillin  Home Medications   Current Outpatient Rx  Name  Route  Sig  Dispense  Refill  . ALBUTEROL SULFATE HFA 108 (90 BASE) MCG/ACT IN AERS   Inhalation   Inhale 2 puffs into the lungs every 6 (six) hours as needed. For shortness of breath and wheezing          . CETIRIZINE HCL 10 MG PO TABS   Oral   Take 10 mg by mouth daily.           . CYCLOBENZAPRINE HCL 10 MG PO TABS   Oral   Take 10 mg by mouth 3 (three) times daily as needed. For pain         . DICLOFENAC SODIUM 75 MG PO TBEC   Oral   Take 1 tablet (75 mg total) by mouth 2 (two) times daily.   14 tablet   0   . ETONOGESTREL-ETHINYL ESTRADIOL 0.12-0.015 MG/24HR VA RING   Vaginal   Place 1 each vaginally every 28 (twenty-eight) days. Insert vaginally and leave in place for 3 consecutive weeks, then remove for 1  week.          Marland Kitchen FLUTICASONE PROPIONATE 50 MCG/ACT NA SUSP   Nasal   Place 2 sprays into the nose daily as needed. Seasonal allergies           . FLUTICASONE-SALMETEROL 250-50 MCG/DOSE IN AEPB   Inhalation   Inhale 1 puff into the lungs every 12 (twelve) hours.         . IBUPROFEN 800 MG PO TABS   Oral   Take 800 mg by mouth every 8 (eight) hours as needed. For pain         . NYSTATIN 100000 UNIT/GM EX OINT   Topical   Apply 1 application topically 3 (three) times daily.         Marland Kitchen PANTOPRAZOLE SODIUM 40 MG PO TBEC   Oral   Take 40 mg by mouth daily.         Marland Kitchen ZOLOFT PO   Oral   Take 50 mg by mouth daily.            BP 120/70  Pulse 89  Temp 98.2 F (36.8 C) (Oral)  SpO2 100%  Physical Exam  Nursing note and vitals reviewed. Constitutional: She is oriented to person, place, and time. She appears well-developed and well-nourished.  HENT:  Head: Normocephalic and atraumatic.  Eyes: EOM are normal. Pupils are equal, round, and reactive to light.  Neck: Normal range of motion. Neck supple.  Cardiovascular: Normal rate, normal heart sounds and intact distal pulses.   Pulmonary/Chest: Effort normal and breath sounds normal.  Abdominal: Bowel sounds are normal. She exhibits no distension. There is no tenderness.  Musculoskeletal: Normal range of motion. She exhibits no edema and no tenderness.  Neurological: She is alert and oriented to person, place, and time. She has normal strength. No cranial nerve deficit or sensory deficit.  Skin: Skin is warm and dry. No rash noted.  Psychiatric: She has a normal mood and affect.    ED Course  Procedures (including critical care time)  Labs Reviewed - No data to display No results found.   No diagnosis found.    MDM  Bronchitis, likely viral but per patient's reported history has had chronic bronchitis symptoms in the past and may benefit from Z-pak now as well. Will also start prednisone. Refills for  Flonase and Advair. PCP follow up.         Artur Winningham B. Bernette Mayers, MD 12/05/12 [phone]

## 2012-12-10 ENCOUNTER — Emergency Department (HOSPITAL_BASED_OUTPATIENT_CLINIC_OR_DEPARTMENT_OTHER)
Admission: EM | Admit: 2012-12-10 | Discharge: 2012-12-10 | Disposition: A | Discharge status: Home / Self Care | Payer: Medicaid Other | Attending: Emergency Medicine | Admitting: Emergency Medicine

## 2012-12-10 ENCOUNTER — Encounter (HOSPITAL_BASED_OUTPATIENT_CLINIC_OR_DEPARTMENT_OTHER): Payer: Self-pay

## 2012-12-10 DIAGNOSIS — M7660 Achilles tendinitis, unspecified leg: Secondary | ICD-10-CM

## 2012-12-10 DIAGNOSIS — Z862 Personal history of diseases of the blood and blood-forming organs and certain disorders involving the immune mechanism: Secondary | ICD-10-CM | POA: Insufficient documentation

## 2012-12-10 DIAGNOSIS — Z791 Long term (current) use of non-steroidal anti-inflammatories (NSAID): Secondary | ICD-10-CM | POA: Insufficient documentation

## 2012-12-10 DIAGNOSIS — S90426A Blister (nonthermal), unspecified lesser toe(s), initial encounter: Secondary | ICD-10-CM

## 2012-12-10 DIAGNOSIS — Z79899 Other long term (current) drug therapy: Secondary | ICD-10-CM | POA: Insufficient documentation

## 2012-12-10 DIAGNOSIS — M2550 Pain in unspecified joint: Secondary | ICD-10-CM | POA: Insufficient documentation

## 2012-12-10 DIAGNOSIS — Z8709 Personal history of other diseases of the respiratory system: Secondary | ICD-10-CM | POA: Insufficient documentation

## 2012-12-10 DIAGNOSIS — IMO0002 Reserved for concepts with insufficient information to code with codable children: Secondary | ICD-10-CM | POA: Insufficient documentation

## 2012-12-10 DIAGNOSIS — Z792 Long term (current) use of antibiotics: Secondary | ICD-10-CM | POA: Insufficient documentation

## 2012-12-10 DIAGNOSIS — K219 Gastro-esophageal reflux disease without esophagitis: Secondary | ICD-10-CM | POA: Insufficient documentation

## 2012-12-10 DIAGNOSIS — Z8679 Personal history of other diseases of the circulatory system: Secondary | ICD-10-CM | POA: Insufficient documentation

## 2012-12-10 DIAGNOSIS — Z8639 Personal history of other endocrine, nutritional and metabolic disease: Secondary | ICD-10-CM | POA: Insufficient documentation

## 2012-12-10 HISTORY — DX: Lactose intolerance, unspecified: E73.9

## 2012-12-10 HISTORY — DX: Bronchitis, not specified as acute or chronic: J40
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
## 2012-12-10 NOTE — ED Provider Notes (Signed)
Medical screening examination/treatment/procedure(s) were performed by non-physician practitioner and as supervising physician I was immediately available for consultation/collaboration.   Loren Racer, MD 12/10/12 [phone]

## 2012-12-22 ENCOUNTER — Encounter (HOSPITAL_BASED_OUTPATIENT_CLINIC_OR_DEPARTMENT_OTHER): Payer: Self-pay | Admitting: *Deleted

## 2012-12-22 ENCOUNTER — Emergency Department (HOSPITAL_BASED_OUTPATIENT_CLINIC_OR_DEPARTMENT_OTHER)
Admission: EM | Admit: 2012-12-22 | Discharge: 2012-12-22 | Discharge status: Against Medical Advice | Payer: Medicaid Other | Attending: Emergency Medicine | Admitting: Emergency Medicine

## 2012-12-22 DIAGNOSIS — R51 Headache: Secondary | ICD-10-CM | POA: Insufficient documentation

## 2012-12-22 DIAGNOSIS — J3489 Other specified disorders of nose and nasal sinuses: Secondary | ICD-10-CM | POA: Insufficient documentation

## 2012-12-22 NOTE — ED Notes (Signed)
Pt c/o congestion and facial pain x 3 days

## 2012-12-22 NOTE — ED Notes (Signed)
Pt approached registration and stated that she was leaving. I instructed her that we were cleaning a room for her at this time but pt insisted on leaving.

## 2013-04-04 DIAGNOSIS — D649 Anemia, unspecified: Secondary | ICD-10-CM | POA: Insufficient documentation

## 2013-04-04 DIAGNOSIS — Z3041 Encounter for surveillance of contraceptive pills: Secondary | ICD-10-CM | POA: Insufficient documentation

## 2013-04-04 DIAGNOSIS — G5600 Carpal tunnel syndrome, unspecified upper limb: Secondary | ICD-10-CM | POA: Insufficient documentation

## 2013-07-30 DIAGNOSIS — M6740 Ganglion, unspecified site: Secondary | ICD-10-CM | POA: Insufficient documentation

## 2013-08-01 ENCOUNTER — Other Ambulatory Visit (HOSPITAL_COMMUNITY): Payer: Self-pay | Admitting: Family Medicine

## 2013-08-01 DIAGNOSIS — K209 Esophagitis, unspecified without bleeding: Secondary | ICD-10-CM

## 2013-08-04 ENCOUNTER — Ambulatory Visit (HOSPITAL_COMMUNITY)
Admission: RE | Admit: 2013-08-04 | Discharge: 2013-08-04 | Disposition: A | Discharge status: Home / Self Care | Payer: Medicaid Other | Source: Ambulatory Visit | Attending: Family Medicine | Admitting: Family Medicine

## 2013-08-04 DIAGNOSIS — K208 Other esophagitis without bleeding: Secondary | ICD-10-CM | POA: Insufficient documentation

## 2013-08-04 DIAGNOSIS — K209 Esophagitis, unspecified without bleeding: Secondary | ICD-10-CM

## 2013-08-04 DIAGNOSIS — K449 Diaphragmatic hernia without obstruction or gangrene: Secondary | ICD-10-CM | POA: Insufficient documentation

## 2013-08-04 DIAGNOSIS — R1110 Vomiting, unspecified: Secondary | ICD-10-CM | POA: Insufficient documentation

## 2013-08-16 ENCOUNTER — Encounter: Payer: Self-pay | Admitting: Gastroenterology

## 2013-11-24 DIAGNOSIS — K649 Unspecified hemorrhoids: Secondary | ICD-10-CM | POA: Insufficient documentation

## 2013-12-17 ENCOUNTER — Encounter (HOSPITAL_COMMUNITY): Payer: Self-pay | Admitting: Emergency Medicine

## 2013-12-17 ENCOUNTER — Emergency Department (HOSPITAL_COMMUNITY)
Admission: EM | Admit: 2013-12-17 | Discharge: 2013-12-17 | Disposition: A | Discharge status: Home / Self Care | Payer: Medicaid Other | Source: Home / Self Care | Attending: Family Medicine | Admitting: Family Medicine

## 2013-12-17 DIAGNOSIS — J069 Acute upper respiratory infection, unspecified: Secondary | ICD-10-CM

## 2013-12-17 HISTORY — DX: Headache: R51

## 2013-12-17 HISTORY — DX: Headache, unspecified: R51.9

## 2013-12-17 MED ORDER — IPRATROPIUM BROMIDE 0.06 % NA SOLN: 2.0000 | Freq: Four times a day (QID) | NASAL | Status: DC

## 2013-12-17 MED ORDER — DEXTROMETHORPHAN POLISTIREX 30 MG/5ML PO LQCR: 60.0000 mg | Freq: Two times a day (BID) | ORAL | Status: DC

## 2013-12-17 NOTE — ED Provider Notes (Signed)
CSN: [id number]     Arrival date & time 12/17/13  1010 History   First MD Initiated Contact with Patient 12/17/13 1120     Chief Complaint  Patient presents with  . Facial Pain     (Consider location/radiation/quality/duration/timing/severity/associated sxs/prior Treatment) Patient is a 33 y.o. female presenting with URI. The history is provided by the patient and a parent.  URI Presenting symptoms: congestion, cough, facial pain and rhinorrhea   Presenting symptoms: no fever   Severity:  Moderate Onset quality:  Gradual Duration:  2 days Progression:  Unchanged Chronicity:  New Relieved by:  None tried Worsened by:  Nothing tried Associated symptoms: sinus pain and sneezing     Past Medical History  Diagnosis Date  . Migraine   . GERD (gastroesophageal reflux disease)   . Lactose intolerance   . Bronchitis   . Sinus headache    Past Surgical History  Procedure Laterality Date  . Tonsillectomy    . Wisdom tooth extraction     History reviewed. No pertinent family history. History  Substance Use Topics  . Smoking status: Never Smoker   . Smokeless tobacco: Not on file  . Alcohol Use: No   OB History   Grav Para Term Preterm Abortions TAB SAB Ect Mult Living                 Review of Systems  Constitutional: Negative.  Negative for fever.  HENT: Positive for congestion, postnasal drip, rhinorrhea and sneezing.   Respiratory: Positive for cough.       Allergies  Amoxicillin  Home Medications   Current Outpatient Rx  Name  Route  Sig  Dispense  Refill  . etonogestrel-ethinyl estradiol (NUVARING) 0.12-0.015 MG/24HR vaginal ring   Vaginal   Place 1 each vaginally every 28 (twenty-eight) days. Insert vaginally and leave in place for 3 consecutive weeks, then remove for 1 week.          . fluticasone (FLONASE) 50 MCG/ACT nasal spray   Nasal   Place 2 sprays into the nose daily as needed. Seasonal allergies           . fluticasone (FLONASE) 50  MCG/ACT nasal spray   Nasal   Place 2 sprays into the nose daily.   16 g   2   . pantoprazole (PROTONIX) 40 MG tablet   Oral   Take 40 mg by mouth daily.         . Sertraline HCl (ZOLOFT PO)   Oral   Take 50 mg by mouth daily.          Marland Kitchen. albuterol (PROVENTIL HFA;VENTOLIN HFA) 108 (90 BASE) MCG/ACT inhaler   Inhalation   Inhale 2 puffs into the lungs every 6 (six) hours as needed. For shortness of breath and wheezing          . azithromycin (ZITHROMAX) 250 MG tablet   Oral   Take 1 tablet (250 mg total) by mouth daily.   4 tablet   0   . benzonatate (TESSALON) 100 MG capsule   Oral   Take 1 capsule (100 mg total) by mouth every 8 (eight) hours.   21 capsule   0   . cetirizine (ZYRTEC) 10 MG tablet   Oral   Take 10 mg by mouth daily.           . cyclobenzaprine (FLEXERIL) 10 MG tablet   Oral   Take 10 mg by mouth 3 (three) times daily as needed. For  pain         . dextromethorphan (DELSYM) 30 MG/5ML liquid   Oral   Take 10 mLs (60 mg total) by mouth 2 (two) times daily. Prn cough   89 mL   1   . diclofenac (VOLTAREN) 75 MG EC tablet   Oral   Take 1 tablet (75 mg total) by mouth 2 (two) times daily.   14 tablet   0   . Fluticasone-Salmeterol (ADVAIR DISKUS) 250-50 MCG/DOSE AEPB   Inhalation   Inhale 1 puff into the lungs 2 (two) times daily.   60 each   0   . Fluticasone-Salmeterol (ADVAIR) 250-50 MCG/DOSE AEPB   Inhalation   Inhale 1 puff into the lungs every 12 (twelve) hours.         Marland Kitchen ibuprofen (ADVIL,MOTRIN) 800 MG tablet   Oral   Take 800 mg by mouth every 8 (eight) hours as needed. For pain         . ipratropium (ATROVENT) 0.06 % nasal spray   Each Nare   Place 2 sprays into both nostrils 4 (four) times daily.   15 mL   1   . nystatin ointment (MYCOSTATIN)   Topical   Apply 1 application topically 3 (three) times daily.         . predniSONE (DELTASONE) 20 MG tablet   Oral   Take 3 tablets (60 mg total) by mouth daily.    15 tablet   0    BP 127/79  Pulse 80  Temp(Src) 98.9 F (37.2 C) (Oral)  Resp 18  SpO2 100% Physical Exam  Nursing note and vitals reviewed. Constitutional: She is oriented to person, place, and time. She appears well-developed and well-nourished.  HENT:  Head: Normocephalic.  Right Ear: External ear normal.  Left Ear: External ear normal.  Nose: Mucosal edema and rhinorrhea present. Right sinus exhibits maxillary sinus tenderness. Left sinus exhibits maxillary sinus tenderness.  Mouth/Throat: Oropharynx is clear and moist.  Eyes: Pupils are equal, round, and reactive to light.  Neck: Normal range of motion. Neck supple.  Cardiovascular: Normal rate, regular rhythm, normal heart sounds and intact distal pulses.   Pulmonary/Chest: Effort normal and breath sounds normal.  Lymphadenopathy:    She has no cervical adenopathy.  Neurological: She is alert and oriented to person, place, and time.  Skin: Skin is warm and dry.    ED Course  Procedures (including critical care time) Labs Review Labs Reviewed - No data to display Imaging Review No results found.    MDM   Final diagnoses:  URI (upper respiratory infection)        Linna Hoff, MD 12/17/13 1725

## 2013-12-17 NOTE — Discharge Instructions (Signed)
Drink plenty of fluids as discussed, use medicine as prescribed, and mucinex or delsym for cough. Return or see your doctor if further problems °

## 2013-12-17 NOTE — ED Notes (Signed)
C/o has another sinus infection like she has every year about this time

## 2014-01-04 DIAGNOSIS — Z789 Other specified health status: Secondary | ICD-10-CM | POA: Insufficient documentation

## 2014-02-22 DIAGNOSIS — M79609 Pain in unspecified limb: Secondary | ICD-10-CM | POA: Insufficient documentation

## 2014-06-02 ENCOUNTER — Ambulatory Visit (INDEPENDENT_AMBULATORY_CARE_PROVIDER_SITE_OTHER): Payer: Medicaid Other

## 2014-06-02 ENCOUNTER — Ambulatory Visit (INDEPENDENT_AMBULATORY_CARE_PROVIDER_SITE_OTHER): Payer: Medicaid Other | Admitting: Podiatrist

## 2014-06-02 ENCOUNTER — Encounter: Payer: Self-pay | Admitting: Podiatrist

## 2014-06-02 VITALS — BP 115/73 | HR 66 | Resp 14 | Ht 61.75 in | Wt 267.0 lb

## 2014-06-02 DIAGNOSIS — M722 Plantar fascial fibromatosis: Secondary | ICD-10-CM

## 2014-06-02 MED ORDER — DICLOFENAC SODIUM 1 % TD GEL: 2.0000 g | Freq: Four times a day (QID) | TRANSDERMAL | Status: DC

## 2014-06-02 NOTE — Patient Instructions (Signed)

## 2014-06-02 NOTE — Progress Notes (Signed)
   Subjective:    Patient ID: Amber Wall, female    DOB: Aug 15, 1981, 33 y.o.   MRN: [id number]  HPI Comments: Pt complains of B/L heel and inferior arch pain for 6 months on and off.  [facility] prescribed Mobic and an antifungal cream OTC.  [facility] and states she got some over-the-counter cream for her to athlete's foot. She also states she has had painful heels in the past and she got injections which hurt but overall help with her pain. She wears sketchers non-slip shoes at work.   Review of Systems  All other systems reviewed and are negative.      Objective:   Physical Exam  Neurovascular status is intact and unchanged. Pain on palpation plantar medial aspect of bilateral heels is noted. Calluses are present on the plantar medial aspect of the right foot it does not appear fungal.      Assessment & Plan:  Assessment: Plantar fasciitis bilateral Plan: Injected bilateral heels with Kenalog and Marcaine mixture. Plantar fascial taping were dispensed- discuss orthotics and she's getting ready to change her insurance coverage and she wants to wait on orthotics to decide on coverage. I will see her back for followup in the future. .Marland Kitchen

## 2014-09-01 ENCOUNTER — Ambulatory Visit: Payer: Medicaid Other | Admitting: Podiatrist

## 2014-09-08 ENCOUNTER — Ambulatory Visit: Payer: Medicaid Other | Admitting: Podiatrist

## 2014-09-27 ENCOUNTER — Emergency Department (HOSPITAL_COMMUNITY)
Admission: EM | Admit: 2014-09-27 | Discharge: 2014-09-27 | Disposition: A | Discharge status: Home / Self Care | Payer: Medicaid Other | Attending: Emergency Medicine | Admitting: Emergency Medicine

## 2014-09-27 ENCOUNTER — Encounter (HOSPITAL_COMMUNITY): Payer: Self-pay | Admitting: Emergency Medicine

## 2014-09-27 DIAGNOSIS — Z791 Long term (current) use of non-steroidal anti-inflammatories (NSAID): Secondary | ICD-10-CM | POA: Insufficient documentation

## 2014-09-27 DIAGNOSIS — K219 Gastro-esophageal reflux disease without esophagitis: Secondary | ICD-10-CM | POA: Diagnosis not present

## 2014-09-27 DIAGNOSIS — Z7951 Long term (current) use of inhaled steroids: Secondary | ICD-10-CM | POA: Insufficient documentation

## 2014-09-27 DIAGNOSIS — Z8679 Personal history of other diseases of the circulatory system: Secondary | ICD-10-CM | POA: Diagnosis not present

## 2014-09-27 DIAGNOSIS — L929 Granulomatous disorder of the skin and subcutaneous tissue, unspecified: Secondary | ICD-10-CM | POA: Diagnosis not present

## 2014-09-27 DIAGNOSIS — Z880 Allergy status to penicillin: Secondary | ICD-10-CM | POA: Insufficient documentation

## 2014-09-27 DIAGNOSIS — Z8709 Personal history of other diseases of the respiratory system: Secondary | ICD-10-CM | POA: Insufficient documentation

## 2014-09-27 DIAGNOSIS — Z79899 Other long term (current) drug therapy: Secondary | ICD-10-CM | POA: Insufficient documentation

## 2014-09-27 DIAGNOSIS — M79602 Pain in left arm: Secondary | ICD-10-CM | POA: Diagnosis present

## 2014-09-27 NOTE — ED Notes (Signed)
Pt arrived to the ED with a compliant of left arm pain.  Pt states that she received a tetanus booster on 30OCT2015 and since then she has had a sore arm and presently she has swelling and a knot associated with the shot site.

## 2014-09-27 NOTE — ED Notes (Signed)
Patient requesting to speak with "doctor over PA". Dr. Norlene Campbelltter made aware of same.

## 2014-09-27 NOTE — Discharge Instructions (Signed)
Continue ibuprofen and warm packs. Follow up with your doctor. This swelling will go down on its own over time.

## 2014-09-27 NOTE — ED Provider Notes (Signed)
CSN: [id number]     Arrival date & time 09/27/14  0046 History   First MD Initiated Contact with Patient 09/27/14 0119     Chief Complaint  Patient presents with  . Arm Pain     (Consider location/radiation/quality/duration/timing/severity/associated sxs/prior Treatment) HPI Amber Wall is a 33 y.o. female who presents to ED with complaint of painful lump in left arm. Pt states she has had this lump in there since receiving it T Deborah vaccination on 09/01/2014. States it has been a month and a half and her arm has not improved. She reports a not to the left arm and pain with palpation of the arm and movement of the arm. She states it is not getting better but not worse. She denies any fever or chills. She denies any redness of the skin or induration. In ibuprofen and applying warm compresses to the area with no relief of her symptoms. She has been to her primary care doctor several times for this and was told that he will improve on its own.     Past Medical History  Diagnosis Date  . Migraine   . GERD (gastroesophageal reflux disease)   . Lactose intolerance   . Bronchitis   . Sinus headache    Past Surgical History  Procedure Laterality Date  . Tonsillectomy    . Wisdom tooth extraction     No family history on file. History  Substance Use Topics  . Smoking status: Never Smoker   . Smokeless tobacco: Never Used  . Alcohol Use: No   OB History    No data available     Review of Systems  Constitutional: Negative for fever and chills.  Musculoskeletal: Positive for myalgias and arthralgias.  Skin: Positive for wound.      Allergies  Amoxicillin  Home Medications   Prior to Admission medications   Medication Sig Start Date End Date Taking? Authorizing Provider  albuterol (PROVENTIL HFA;VENTOLIN HFA) 108 (90 BASE) MCG/ACT inhaler Inhale 2 puffs into the lungs every 6 (six) hours as needed. For shortness of breath and wheezing    Yes Historical Provider, MD   cetirizine (ZYRTEC) 10 MG tablet Take 10 mg by mouth daily.     Yes Historical Provider, MD  diclofenac sodium (VOLTAREN) 1 % GEL Apply 2 g topically 4 (four) times daily. Rub into affected area of foot 2 to 4 times daily 06/02/14  Yes Delories HeinzKathryn P Egerton, DPM  fluticasone (FLONASE) 50 MCG/ACT nasal spray Place 2 sprays into the nose daily as needed. Seasonal allergies     Yes Historical Provider, MD  guaiFENesin (MUCINEX) 600 MG 12 hr tablet Take by mouth 2 (two) times daily as needed for cough.   Yes Historical Provider, MD  ibuprofen (ADVIL,MOTRIN) 800 MG tablet Take 800 mg by mouth every 8 (eight) hours as needed. For pain   Yes Historical Provider, MD  Olopatadine HCl (PATADAY) 0.2 % SOLN Apply 1 drop to eye daily.   Yes Historical Provider, MD  pantoprazole (PROTONIX) 40 MG tablet Take 40 mg by mouth daily.   Yes Historical Provider, MD  phenylephrine (SUDAFED PE) 10 MG TABS tablet Take 10 mg by mouth every 4 (four) hours as needed (cold symptoms).   Yes Historical Provider, MD  azithromycin (ZITHROMAX) 250 MG tablet Take 1 tablet (250 mg total) by mouth daily. Patient not taking: Reported on 09/27/2014 12/05/12   Leonette Mostharles B. Bernette MayersSheldon, MD  benzonatate (TESSALON) 100 MG capsule Take 1 capsule (100 mg total)  by mouth every 8 (eight) hours. Patient not taking: Reported on 09/27/2014 12/05/12   Leonette Most B. Bernette Mayers, MD  dextromethorphan (DELSYM) 30 MG/5ML liquid Take 10 mLs (60 mg total) by mouth 2 (two) times daily. Prn cough Patient not taking: Reported on 09/27/2014 12/17/13   Linna Hoff, MD  diclofenac (VOLTAREN) 75 MG EC tablet Take 1 tablet (75 mg total) by mouth 2 (two) times daily. Patient not taking: Reported on 09/27/2014 08/11/12   Elson Areas, PA-C  fluticasone [facility]) 50 MCG/ACT nasal spray Place 2 sprays into the nose daily. Patient not taking: Reported on 09/27/2014 12/05/12   Leonette Most B. Bernette Mayers, MD  Fluticasone-Salmeterol (ADVAIR DISKUS) 250-50 MCG/DOSE AEPB Inhale 1 puff into the  lungs 2 (two) times daily. Patient not taking: Reported on 09/27/2014 12/05/12   Leonette Most B. Bernette Mayers, MD  ipratropium (ATROVENT) 0.06 % nasal spray Place 2 sprays into both nostrils 4 (four) times daily. Patient not taking: Reported on 09/27/2014 12/17/13   Linna Hoff, MD  predniSONE (DELTASONE) 20 MG tablet Take 3 tablets (60 mg total) by mouth daily. Patient not taking: Reported on 09/27/2014 12/05/12   Leonette Most B. Bernette Mayers, MD   BP 129/80 mmHg  Pulse 73  Temp(Src) 98 F (36.7 C) (Oral)  Resp 18  Ht 5' 1.75" (1.568 m)  Wt 154 lb (69.854 kg)  BMI 28.41 kg/m2  SpO2 100%  LMP 09/09/2014 Physical Exam  Constitutional: She appears well-developed and well-nourished. No distress.  HENT:  Head: Normocephalic.  Eyes: Conjunctivae are normal.  Neck: Neck supple.  Cardiovascular: Normal rate, regular rhythm and normal heart sounds.   Pulmonary/Chest: Effort normal and breath sounds normal. No respiratory distress. She has no wheezes. She has no rales.  Musculoskeletal: She exhibits no edema.  Hard area palpated under the skin of the lateral left upper arm. Area is not warm to the touch. No cellulitic changes. Measures about 3x3cm. Full ROM of left shoulder and elbow.   Neurological: She is alert.  Skin: Skin is warm and dry.  Psychiatric: She has a normal mood and affect. Her behavior is normal.  Nursing note and vitals reviewed.   ED Course  Procedures (including critical care time) Labs Review Labs Reviewed - No data to display  Imaging Review No results found.   EKG Interpretation None      MDM   Final diagnoses:  Granuloma of skin    patient with a firm palpable mass to the left upper arm after receiving a tetanus vaccine a month and a half ago. Patient has had altered visits to her doctor for this. On the exam, I did palpate a firm mass to the upper arm, with no signs of infection. Literature does support granuloma formation when T gap is injected into the fat cells. Patient  is morbidly obese, and I believe that is what might have happened. I explained to patient that I did not think that her swelling in her arm is anything serious, To her that I think it is cranial lumbar from medication causing inflammatory reaction when was injected in the tissue. I explained that eventually this will improve, but it may take weeks to months. Patient became upset, stated "[place] and do some type of test to tell me exactly what it is." Patient stated that she "don't want a "it MAY be this" answer but want "this is what this is" answer." I explained to her that at this time no further imaging or tests are indicated, and she is to  follow-up with her primary care doctor. Patient stated that she did not trust her primary care doctor. I have advised her that perhaps she needs to find a new one if she does not trust her doctor. I have then attempted to explain her the treatment plan, however she interrupted me and became upset and told me that she was done with me. Dr. Norlene Campbelltter went to speak with pt. Pt is stable for d/c home.   Pt is afebrile. Non toxic appearing. No further emergent treatment indicated.   Filed Vitals:   09/27/14 0056 09/27/14 0301  BP: 129/80 124/78  Pulse: 73 71  Temp: 98 F (36.7 C)   TempSrc: Oral   Resp: 18 18  Height: 5' 1.75" (1.568 m)   Weight: 154 lb (69.854 kg)   SpO2: 100% 98%     Lottie Musselatyana A Ameira Alessandrini, PA-C 09/27/14 0310  Olivia Mackielga M Otter, MD 09/27/14 [phone]

## 2014-10-29 ENCOUNTER — Encounter (HOSPITAL_COMMUNITY): Payer: Self-pay | Admitting: Emergency Medicine

## 2014-10-29 ENCOUNTER — Emergency Department (INDEPENDENT_AMBULATORY_CARE_PROVIDER_SITE_OTHER)
Admission: EM | Admit: 2014-10-29 | Discharge: 2014-10-29 | Disposition: A | Discharge status: Home / Self Care | Payer: Self-pay | Source: Home / Self Care | Attending: Family Medicine | Admitting: Family Medicine

## 2014-10-29 DIAGNOSIS — M25473 Effusion, unspecified ankle: Secondary | ICD-10-CM

## 2014-10-29 DIAGNOSIS — J40 Bronchitis, not specified as acute or chronic: Secondary | ICD-10-CM

## 2014-10-29 DIAGNOSIS — J029 Acute pharyngitis, unspecified: Secondary | ICD-10-CM

## 2014-10-29 LAB — POCT RAPID STREP A: Streptococcus, Group A Screen (Direct): NEGATIVE

## 2014-10-29 MED ORDER — PREDNISONE 10 MG PO TABS: 30.0000 mg | ORAL_TABLET | Freq: Every day | ORAL | Status: DC

## 2014-10-29 MED ORDER — TRAMADOL HCL 50 MG PO TABS: 50.0000 mg | ORAL_TABLET | Freq: Every evening | ORAL | Status: DC | PRN

## 2014-10-29 NOTE — ED Notes (Signed)
Pt states that she has chronic bronchitis for years and she states that she has a sore throat for 3 days.

## 2014-10-29 NOTE — ED Provider Notes (Signed)
Amber Wall is a 33 y.o. female who presents to Urgent Care today for sore throat present for 3-4 days since she was a hoarse voice. Additionally patient is coughing but not wheezing much. She has a history of chronic proctitis and has been using her albuterol nebulizer every 4 hours for several days now. This helps. No fevers or chills vomiting or diarrhea.   Past Medical History  Diagnosis Date  . Migraine   . GERD (gastroesophageal reflux disease)   . Lactose intolerance   . Bronchitis   . Sinus headache    Past Surgical History  Procedure Laterality Date  . Tonsillectomy    . Wisdom tooth extraction     History  Substance Use Topics  . Smoking status: Never Smoker   . Smokeless tobacco: Never Used  . Alcohol Use: No   ROS as above Medications: No current facility-administered medications for this encounter.   Current Outpatient Prescriptions  Medication Sig Dispense Refill  . cetirizine (ZYRTEC) 10 MG tablet Take 10 mg by mouth daily.      Marland Kitchen. albuterol (PROVENTIL HFA;VENTOLIN HFA) 108 (90 BASE) MCG/ACT inhaler Inhale 2 puffs into the lungs every 6 (six) hours as needed. For shortness of breath and wheezing     . guaiFENesin (MUCINEX) 600 MG 12 hr tablet Take by mouth 2 (two) times daily as needed for cough.    Marland Kitchen. ibuprofen (ADVIL,MOTRIN) 800 MG tablet Take 800 mg by mouth every 8 (eight) hours as needed. For pain    . Olopatadine HCl (PATADAY) 0.2 % SOLN Apply 1 drop to eye daily.    . pantoprazole (PROTONIX) 40 MG tablet Take 40 mg by mouth daily.    . phenylephrine (SUDAFED PE) 10 MG TABS tablet Take 10 mg by mouth every 4 (four) hours as needed (cold symptoms).    . predniSONE (DELTASONE) 10 MG tablet Take 3 tablets (30 mg total) by mouth daily. 15 tablet 0  . traMADol (ULTRAM) 50 MG tablet Take 1 tablet (50 mg total) by mouth at bedtime as needed (cough). 10 tablet 0  . [DISCONTINUED] fluticasone (FLONASE) 50 MCG/ACT nasal spray Place 2 sprays into the nose daily.  (Patient not taking: Reported on 09/27/2014) 16 g 2  . [DISCONTINUED] ipratropium (ATROVENT) 0.06 % nasal spray Place 2 sprays into both nostrils 4 (four) times daily. (Patient not taking: Reported on 09/27/2014) 15 mL 1   Allergies  Allergen Reactions  . Amoxicillin Rash     Exam:  BP 132/87 mmHg  Pulse 74  Temp(Src) 98.6 F (37 C) (Oral)  Resp 18  SpO2 99%  LMP 10/29/2014 Gen: Well NAD HEENT: EOMI,  MMM posterior pharynx cobblestoning. Normal tympanic membranes bilaterally. Lungs: Normal work of breathing. CTABL Heart: RRR no MRG Abd: NABS, Soft. Nondistended, Nontender Exts: Brisk capillary refill, warm and well perfused.   Results for orders placed or performed during the hospital encounter of 10/29/14 (from the past 24 hour(s))  POCT rapid strep A [facility])     Status: None   Collection Time: 10/29/14 12:34 PM  Result Value Ref Range   Streptococcus, Group A Screen (Direct) NEGATIVE NEGATIVE   No results found.  Assessment and Plan: 33 y.o. female with viral bronchitis and pharyngitis. Treatment with prednisone tramadol for cough and continue albuterol.  Discussed warning signs or symptoms. Please see discharge instructions. Patient expresses understanding.     Rodolph BongEvan S Neko Boyajian, MD 10/29/14 [phone]

## 2014-10-29 NOTE — Discharge Instructions (Signed)
Thank you for coming in today. Call or go to the emergency room if you get worse, have trouble breathing, have chest pains, or palpitations.  Do not take tramadol and drive.  Continue albuterol as needed Acute Bronchitis Bronchitis is inflammation of the airways that extend from the windpipe into the lungs (bronchi). The inflammation often causes mucus to develop. This leads to a cough, which is the most common symptom of bronchitis.  In acute bronchitis, the condition usually develops suddenly and goes away over time, usually in a couple weeks. Smoking, allergies, and asthma can make bronchitis worse. Repeated episodes of bronchitis may cause further lung problems.  CAUSES Acute bronchitis is most often caused by the same virus that causes a cold. The virus can spread from person to person (contagious) through coughing, sneezing, and touching contaminated objects. SIGNS AND SYMPTOMS   Cough.   Fever.   Coughing up mucus.   Body aches.   Chest congestion.   Chills.   Shortness of breath.   Sore throat.  DIAGNOSIS  Acute bronchitis is usually diagnosed through a physical exam. Your health care provider will also ask you questions about your medical history. Tests, such as chest X-rays, are sometimes done to rule out other conditions.  TREATMENT  Acute bronchitis usually goes away in a couple weeks. Oftentimes, no medical treatment is necessary. Medicines are sometimes given for relief of fever or cough. Antibiotic medicines are usually not needed but may be prescribed in certain situations. In some cases, an inhaler may be recommended to help reduce shortness of breath and control the cough. A cool mist vaporizer may also be used to help thin bronchial secretions and make it easier to clear the chest.  HOME CARE INSTRUCTIONS  Get plenty of rest.   Drink enough fluids to keep your urine clear or pale yellow (unless you have a medical condition that requires fluid  restriction). Increasing fluids may help thin your respiratory secretions (sputum) and reduce chest congestion, and it will prevent dehydration.   Take medicines only as directed by your health care provider.  If you were prescribed an antibiotic medicine, finish it all even if you start to feel better.  Avoid smoking and secondhand smoke. Exposure to cigarette smoke or irritating chemicals will make bronchitis worse. If you are a smoker, consider using nicotine gum or skin patches to help control withdrawal symptoms. Quitting smoking will help your lungs heal faster.   Reduce the chances of another bout of acute bronchitis by washing your hands frequently, avoiding people with cold symptoms, and trying not to touch your hands to your mouth, nose, or eyes.   Keep all follow-up visits as directed by your health care provider.  SEEK MEDICAL CARE IF: Your symptoms do not improve after 1 week of treatment.  SEEK IMMEDIATE MEDICAL CARE IF:  You develop an increased fever or chills.   You have chest pain.   You have severe shortness of breath.  You have bloody sputum.   You develop dehydration.  You faint or repeatedly feel like you are going to pass out.  You develop repeated vomiting.  You develop a severe headache. MAKE SURE YOU:   Understand these instructions.  Will watch your condition.  Will get help right away if you are not doing well or get worse. Document Released: 11/27/2004 Document Revised: 03/06/2014 Document Reviewed: 04/12/2013 [facility] Patient Information 2015 [place], [place]. This information is not intended to replace advice given to you by your health care provider.  Make sure you discuss any questions you have with your health care provider. ° °

## 2014-10-31 LAB — CULTURE, GROUP A STREP

## 2014-11-17 DIAGNOSIS — J4541 Moderate persistent asthma with (acute) exacerbation: Secondary | ICD-10-CM | POA: Insufficient documentation

## 2015-06-19 ENCOUNTER — Encounter: Payer: Medicaid Other | Admitting: Certified Nurse Midwife

## 2015-07-03 ENCOUNTER — Ambulatory Visit (INDEPENDENT_AMBULATORY_CARE_PROVIDER_SITE_OTHER): Payer: Medicaid Other | Admitting: Podiatry

## 2015-07-03 ENCOUNTER — Encounter: Payer: Self-pay | Admitting: Podiatry

## 2015-07-03 VITALS — BP 117/69 | HR 73 | Resp 14

## 2015-07-03 DIAGNOSIS — M722 Plantar fascial fibromatosis: Secondary | ICD-10-CM | POA: Diagnosis not present

## 2015-07-03 DIAGNOSIS — I8390 Asymptomatic varicose veins of unspecified lower extremity: Secondary | ICD-10-CM

## 2015-07-03 NOTE — Patient Instructions (Signed)
Rx for knee length compression hose issued today for varicose veins Plantar Fasciitis Plantar fasciitis is a common condition that causes foot pain. It is soreness (inflammation) of the band of tough fibrous tissue on the bottom of the foot that runs from the heel bone (calcaneus) to the ball of the foot. The cause of this soreness may be from excessive standing, poor fitting shoes, running on hard surfaces, being overweight, having an abnormal walk, or overuse (this is common in runners) of the painful foot or feet. It is also common in aerobic exercise dancers and ballet dancers. SYMPTOMS  Most people with plantar fasciitis complain of:  Severe pain in the morning on the bottom of their foot especially when taking the first steps out of bed. This pain recedes after a few minutes of walking.  Severe pain is experienced also during walking following a long period of inactivity.  Pain is worse when walking barefoot or up stairs DIAGNOSIS   Your caregiver will diagnose this condition by examining and feeling your foot.  Special tests such as X-rays of your foot, are usually not needed. PREVENTION   Consult a sports medicine professional before beginning a new exercise program.  Walking programs offer a good workout. With walking there is a lower chance of overuse injuries common to runners. There is less impact and less jarring of the joints.  Begin all new exercise programs slowly. If problems or pain develop, decrease the amount of time or distance until you are at a comfortable level.  Wear good shoes and replace them regularly.  Stretch your foot and the heel cords at the back of the ankle (Achilles tendon) both before and after exercise.  Run or exercise on even surfaces that are not hard. For example, asphalt is better than pavement.  Do not run barefoot on hard surfaces.  If using a treadmill, vary the incline.  Do not continue to workout if you have foot or joint problems.  Seek professional help if they do not improve. HOME CARE INSTRUCTIONS   Avoid activities that cause you pain until you recover.  Use ice or cold packs on the problem or painful areas after working out.  Only take over-the-counter or prescription medicines for pain, discomfort, or fever as directed by your caregiver.  Soft shoe inserts or athletic shoes with air or gel sole cushions may be helpful.  If problems continue or become more severe, consult a sports medicine caregiver or your own health care provider. Cortisone is a potent anti-inflammatory medication that may be injected into the painful area. You can discuss this treatment with your caregiver. MAKE SURE YOU:   Understand these instructions.  Will watch your condition.  Will get help right away if you are not doing well or get worse. Document Released: 07/15/2001 Document Revised: 01/12/2012 Document Reviewed: 09/13/2008 [facility] Patient Information 2015 [place], [place]. This information is not intended to replace advice given to you by your health care provider. Make sure you discuss any questions you have with your health care provider.

## 2015-07-03 NOTE — Progress Notes (Signed)
   Subjective:    Patient ID: Amber Wall, female    DOB: 1981-04-10, 34 y.o.   MRN: [id number]  HPI  on  Patient presents today requesting evaluation of bilateral plantar foot pain. Pain occurs on initial standing and walking and relieved with rest and elevation. Patient currently wears athletic style shoes. He has a history of bilateral heel pain that was injected with Kenalog in July 2015 which resolved the heel pain until present Also patient is complaining of pain and varicose veins in her lower extremity. She says she was given a prescription for compression hose, however, the supplier changed name and address and was not able to provide prescription compression hose. She says the veins are more comfortable with standing and walking and the bilateral lower leg and calf area  Patient is currently 3 months pregnant and has not worked in the past 1 month      Review of Systems  Cardiovascular: Positive for leg swelling.       Objective:   Physical Exam  Orientated 3  Vascular: No calf pain or calf edema bilaterally Varicosities posterior legs bilaterally  DP and PT pulses 2/4 bilaterally  Neurological: Ankle reflex equal and reactive bilaterally Sensation to 10 g monofilament wire intact 5/5 bilaterally  Musculoskeletal: HAV deformities bilaterally Palpable tenderness medial plantar fascial area right without a palpable lesions Mild palpable tenderness medial plantar left fascial area without any lesions There is no restriction ankle, subtalar, midtarsal joints bilaterally       Assessment & Plan:   Assessment: Varicosities lower extremities bilaterally Plantar fasciitis bilaterally Patient is 3 months pregnant  Plan: Patient requested prescription for compression hose for varicosities, lower extremities bilaterally Rx 8-14 millimeter compression hose, knee length for the indication of varicosities lower extremities Patient advised to wear athletic style  shoes and shoeing and stretching discussed Removable arch pads made for patient and dispensed today

## 2015-10-09 ENCOUNTER — Ambulatory Visit (INDEPENDENT_AMBULATORY_CARE_PROVIDER_SITE_OTHER): Payer: Medicaid Other | Admitting: Podiatry

## 2015-10-09 ENCOUNTER — Encounter: Payer: Self-pay | Admitting: Podiatry

## 2015-10-09 VITALS — BP 106/74 | HR 87 | Resp 12

## 2015-10-09 DIAGNOSIS — M722 Plantar fascial fibromatosis: Secondary | ICD-10-CM | POA: Diagnosis not present

## 2015-10-09 NOTE — Progress Notes (Signed)
   Subjective:    Patient ID: Amber Wall, female    DOB: April 26, 1981, 34 y.o.   MRN: [id number]  HPI  This patient presents again complaining of bilateral plantar pain right greater than left. The pain occurs on weightbearing and is relieved with rest. Patient has noticed increasing symptoms in the last month upon standing walking. She wears athletic style shoes and does gentle stretching. This does reduce some of her symptoms marginally. Patient is approximately 6 months pregnant  Review of Systems  Musculoskeletal: Positive for back pain, joint swelling and gait problem.       Objective:   Physical Exam Orientated 3  Vascular: No peripheral edema noted bilaterally DP and PT pulses 2/4 bilaterally Capillary reflex immediate bilaterally  Neurological: Ankle reflex equal reactive bilaterally Vibratory sensation intact bilaterally  Musculoskeletal: HAV deformities bilaterally Palpable tenderness medial fascial band right without any palpable lesions. Mild palpable tenderness medial fascial band left without a palpable lesions  Dermatological: No skin lesions noted bilaterally Texture and turgor within normal limits, bilaterally        Assessment & Plan:   Assessment: Plantar fasciitis right greater than left  Plan: Discuss correction shoeing and stretching with patient in detail today Dispense plantar fasciitis strap to wear and right foot inside and athletic style shoe  Reappoint at patient's request

## 2015-10-09 NOTE — Patient Instructions (Signed)
Wear the fasciitis strap on the right foot in athletic style shoe and ongoing daily basis Stretching on a daily basis  Bent - Knee Calf Stretch  1) Stand an arm's length away from a wall. Place the palms of your hands on the wall. Step forward about 12 inches with the opposite foot.  2) Keeping toes pointed forward and both heels on the floor, bend both knees and lean forward. Hold this position for 60 seconds. Don't arch your back and don't hunch your shoulders.  3) Repeat this twice.  DO THIS STRETCHING TECHNIQUE 3 TIMES A DAY.   Stretching Exercises before Standing  Pull your toes up toward your nose and hold for 1 minute before standing.  A towel can assist with this exercise if you put the towel under the ball of your foot. This exercise reduces the intense   pain associated when changing from a seated to a standing position. This stretch can usually be the most beneficial if done before getting out of bed in the mornings. Plantar Fasciitis Plantar fasciitis is a painful foot condition that affects the heel. It occurs when the band of tissue that connects the toes to the heel bone (plantar fascia) becomes irritated. This can happen after exercising too much or doing other repetitive activities (overuse injury). The pain from plantar fasciitis can range from mild irritation to severe pain that makes it difficult for you to walk or move. The pain is usually worse in the morning or after you have been sitting or lying down for a while. CAUSES This condition may be caused by:  Standing for long periods of time.  Wearing shoes that do not fit.  Doing high-impact activities, including running, aerobics, and ballet.  Being overweight.  Having an abnormal way of walking (gait).  Having tight calf muscles.  Having high arches in your feet.  Starting a new athletic activity. SYMPTOMS The main symptom of this condition is heel pain. Other symptoms include:  Pain that gets worse  after activity or exercise.  Pain that is worse in the morning or after resting.  Pain that goes away after you walk for a few minutes. DIAGNOSIS This condition may be diagnosed based on your signs and symptoms. Your health care provider will also do a physical exam to check for:  A tender area on the bottom of your foot.  A high arch in your foot.  Pain when you move your foot.  Difficulty moving your foot. You may also need to have imaging studies to confirm the diagnosis. These can include:  X-rays.  Ultrasound.  MRI. TREATMENT  Treatment for plantar fasciitis depends on the severity of the condition. Your treatment may include:  Rest, ice, and over-the-counter pain medicines to manage your pain.  Exercises to stretch your calves and your plantar fascia.  A splint that holds your foot in a stretched, upward position while you sleep (night splint).  Physical therapy to relieve symptoms and prevent problems in the future.  Cortisone injections to relieve severe pain.  Extracorporeal shock wave therapy (ESWT) to stimulate damaged plantar fascia with electrical impulses. It is often used as a last resort before surgery.  Surgery, if other treatments have not worked after 12 months. HOME CARE INSTRUCTIONS  Take medicines only as directed by your health care provider.  Avoid activities that cause pain.  Roll the bottom of your foot over a bag of ice or a bottle of cold water. Do this for 20 minutes, 3-4  times a day.  Perform simple stretches as directed by your health care provider.  Try wearing athletic shoes with air-sole or gel-sole cushions or soft shoe inserts.  Wear a night splint while sleeping, if directed by your health care provider.  Keep all follow-up appointments with your health care provider. PREVENTION   Do not perform exercises or activities that cause heel pain.  Consider finding low-impact activities if you continue to have problems.  Lose  weight if you need to. The best way to prevent plantar fasciitis is to avoid the activities that aggravate your plantar fascia. SEEK MEDICAL CARE IF:  Your symptoms do not go away after treatment with home care measures.  Your pain gets worse.  Your pain affects your ability to move or do your daily activities.   This information is not intended to replace advice given to you by your health care provider. Make sure you discuss any questions you have with your health care provider.   Document Released: 07/15/2001 Document Revised: 07/11/2015 Document Reviewed: 08/30/2014 Elsevier Interactive Patient Education [employer]2016 [employer].

## 2015-12-28 ENCOUNTER — Inpatient Hospital Stay (HOSPITAL_COMMUNITY)
Admission: AD | Admit: 2015-12-28 | Discharge: 2015-12-28 | Disposition: A | Discharge status: Home / Self Care | Payer: No Typology Code available for payment source | Source: Ambulatory Visit | Attending: Family Medicine | Admitting: Family Medicine

## 2015-12-28 ENCOUNTER — Encounter (HOSPITAL_COMMUNITY): Payer: Self-pay

## 2015-12-28 DIAGNOSIS — Z3A38 38 weeks gestation of pregnancy: Secondary | ICD-10-CM

## 2015-12-28 DIAGNOSIS — O9A213 Injury, poisoning and certain other consequences of external causes complicating pregnancy, third trimester: Secondary | ICD-10-CM

## 2015-12-28 DIAGNOSIS — M549 Dorsalgia, unspecified: Secondary | ICD-10-CM

## 2015-12-28 DIAGNOSIS — O9989 Other specified diseases and conditions complicating pregnancy, childbirth and the puerperium: Secondary | ICD-10-CM

## 2015-12-28 DIAGNOSIS — Z880 Allergy status to penicillin: Secondary | ICD-10-CM | POA: Insufficient documentation

## 2015-12-28 DIAGNOSIS — T1490 Injury, unspecified: Secondary | ICD-10-CM | POA: Diagnosis not present

## 2015-12-28 DIAGNOSIS — O26893 Other specified pregnancy related conditions, third trimester: Secondary | ICD-10-CM | POA: Insufficient documentation

## 2015-12-28 DIAGNOSIS — Y92481 Parking lot as the place of occurrence of the external cause: Secondary | ICD-10-CM | POA: Insufficient documentation

## 2015-12-28 LAB — URINE MICROSCOPIC-ADD ON

## 2015-12-28 LAB — URINALYSIS, ROUTINE W REFLEX MICROSCOPIC
Bilirubin Urine: NEGATIVE
Glucose, UA: NEGATIVE mg/dL
Ketones, ur: NEGATIVE mg/dL
Leukocytes, UA: NEGATIVE
NITRITE: NEGATIVE
PROTEIN: NEGATIVE mg/dL
pH: 6 (ref 5.0–8.0)

## 2015-12-28 MED ORDER — ACETAMINOPHEN 500 MG PO TABS
1000.0000 mg | ORAL_TABLET | Freq: Once | ORAL | Status: AC
  Administered 2015-12-28: 1000 mg via ORAL
  Filled 2015-12-28: qty 2

## 2015-12-28 MED ORDER — ACETAMINOPHEN 325 MG PO TABS: 650.0000 mg | ORAL_TABLET | Freq: Four times a day (QID) | ORAL | Status: DC | PRN

## 2015-12-28 NOTE — MAU Note (Signed)
Meal tray given 

## 2015-12-28 NOTE — MAU Note (Signed)
Called report to Philipp Deputy CNM received order for Tylenol and she will see patient after attending a delivery.
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
## 2016-03-03 ENCOUNTER — Emergency Department (HOSPITAL_BASED_OUTPATIENT_CLINIC_OR_DEPARTMENT_OTHER)
Admission: EM | Admit: 2016-03-03 | Discharge: 2016-03-04 | Disposition: A | Discharge status: Home / Self Care | Payer: Medicaid Other | Attending: Emergency Medicine | Admitting: Emergency Medicine

## 2016-03-03 ENCOUNTER — Encounter (HOSPITAL_BASED_OUTPATIENT_CLINIC_OR_DEPARTMENT_OTHER): Payer: Self-pay | Admitting: *Deleted

## 2016-03-03 DIAGNOSIS — Y999 Unspecified external cause status: Secondary | ICD-10-CM | POA: Insufficient documentation

## 2016-03-03 DIAGNOSIS — S30861A Insect bite (nonvenomous) of abdominal wall, initial encounter: Secondary | ICD-10-CM | POA: Diagnosis present

## 2016-03-03 DIAGNOSIS — W57XXXA Bitten or stung by nonvenomous insect and other nonvenomous arthropods, initial encounter: Secondary | ICD-10-CM | POA: Insufficient documentation

## 2016-03-03 DIAGNOSIS — L858 Other specified epidermal thickening: Secondary | ICD-10-CM | POA: Diagnosis not present

## 2016-03-03 DIAGNOSIS — Y929 Unspecified place or not applicable: Secondary | ICD-10-CM | POA: Diagnosis not present

## 2016-03-03 DIAGNOSIS — Y939 Activity, unspecified: Secondary | ICD-10-CM | POA: Diagnosis not present

## 2016-03-03 MED ORDER — CETIRIZINE HCL 10 MG PO TBDP: 10.0000 mg | ORAL_TABLET | Freq: Every day | ORAL | Status: DC

## 2016-03-03 MED ORDER — LORATADINE 10 MG PO TABS
10.0000 mg | ORAL_TABLET | Freq: Once | ORAL | Status: AC
  Administered 2016-03-03: 10 mg via ORAL
  Filled 2016-03-03: qty 1

## 2016-03-03 NOTE — ED Provider Notes (Signed)
CSN: [id number]     Arrival date & time 03/03/16  2217 History  By signing my name below, I, Linna DarnerRussell Turner, attest that this documentation has been prepared under the direction and in the presence of physician practitioner, Lyanne Kates, MD. Electronically Signed: Linna Darnerussell Turner, Scribe. 03/03/2016. 11:46 PM.   Chief Complaint  Patient presents with  . Insect Bite    Patient is a 35 y.o. female presenting with animal bite. The history is provided by the patient. No language interpreter was used.  Animal Bite Contact animal:  Insect Location:  Torso Torso injury location:  Abd LLQ Time since incident:  5 days Pain details:    Quality:  Unable to specify   Severity:  Unable to specify   Timing:  Unable to specify   Progression:  Unable to specify Incident location:  Unable to specify Provoked: unprovoked   Notifications:  None Animal's rabies vaccination status:  Not immunized Animal in possession: no   Tetanus status:  Unknown Relieved by:  Nothing Worsened by:  Nothing tried Ineffective treatments:  Prescription drugs Associated symptoms: rash (abdomen)   Associated symptoms: no fever and no numbness   Rash:    Location:  Abdomen   Severity:  Unable to specify   Onset quality:  Sudden   Duration:  5 days   Timing:  Constant   Progression:  Worsening    HPI Comments: Nakiyah A Eliberto Ivoryustin is a 35 y.o. female who presents to the Emergency Department complaining of insect bite sustained to her lower left abdomen approximately 5 days ago. Pt states that her symptoms first began as a "bump" on her lower left abdomen. She states that she applied fungal cream 2 days ago and her rash has spread across her abdomen since then. Pt has not tried any other medications for her symptoms. Pt denies nausea, vomiting, numbness, or any other associated symptoms.  Past Medical History  Diagnosis Date  . Migraine   . GERD (gastroesophageal reflux disease)   . Lactose intolerance   . Bronchitis   .  Sinus headache    Past Surgical History  Procedure Laterality Date  . Tonsillectomy    . Wisdom tooth extraction     History reviewed. No pertinent family history. Social History  Substance Use Topics  . Smoking status: Never Smoker   . Smokeless tobacco: Never Used  . Alcohol Use: No   OB History    Gravida Para Term Preterm AB TAB SAB Ectopic Multiple Living   3 1 1  1 1    1      Review of Systems  Constitutional: Negative for fever and chills.  HENT: Negative for congestion, drooling and facial swelling.   Gastrointestinal: Negative for nausea and vomiting.  Skin: Positive for rash (abdomen).  Neurological: Negative for numbness.  All other systems reviewed and are negative.  Allergies  Amoxicillin  Home Medications   Prior to Admission medications   Medication Sig Start Date End Date Taking? Authorizing Provider  acetaminophen (TYLENOL) 325 MG tablet Take 2 tablets (650 mg total) by mouth every 6 (six) hours as needed. 12/28/15   Arabella MerlesKimberly D Shaw, CNM  cetirizine (ZYRTEC) 10 MG tablet Take 10 mg by mouth daily.      Historical Provider, MD  ferrous sulfate 325 (65 FE) MG tablet Take 325 mg by mouth daily with breakfast.    Historical Provider, MD  Fluticasone-Salmeterol (ADVAIR DISKUS) 250-50 MCG/DOSE AEPB Inhale into the lungs. 05/15/15 05/14/16  Historical Provider, MD  Prenatal Vit-Fe Fumarate-FA (PRENATAL MULTIVITAMIN) TABS tablet Take 1 tablet by mouth daily at 12 noon.    Historical Provider, MD  ranitidine (ZANTAC) 150 MG tablet Take 150 mg by mouth 2 (two) times daily.    Historical Provider, MD   BP 126/78 mmHg  Pulse 77  Temp(Src) 97.8 F (36.6 C) (Oral)  Resp 16  Ht 5' 1.75" (1.568 m)  Wt 248 lb (112.492 kg)  BMI 45.75 kg/m2  SpO2 97%  LMP 03/01/2016  Breastfeeding? Unknown Physical Exam  Constitutional: She is oriented to person, place, and time. She appears well-developed and well-nourished. No distress.  HENT:  Head: Normocephalic and atraumatic.   Mouth/Throat: Oropharynx is clear and moist and mucous membranes are normal.  No swelling of the lips.  Eyes: Conjunctivae and EOM are normal. Pupils are equal, round, and reactive to light.  Neck: Normal range of motion. Neck supple. No tracheal deviation present.  Cardiovascular: Normal rate, regular rhythm, normal heart sounds and intact distal pulses.   Pulmonary/Chest: Effort normal and breath sounds normal. No respiratory distress. She has no wheezes. She has no rales.  Abdominal: Soft. Bowel sounds are normal. There is no tenderness. There is no rebound and no guarding.  Keratosis pilaris.  Musculoskeletal: Normal range of motion.  Neurological: She is alert and oriented to person, place, and time.  Skin: Skin is warm and dry. Rash noted.  "Goose flesh" in the goose and upper thighs consistent with keratosis pilaris    Psychiatric: She has a normal mood and affect. Her behavior is normal.  Nursing note and vitals reviewed.   ED Course  Procedures (including critical care time)  DIAGNOSTIC STUDIES: Oxygen Saturation is 97% on RA, normal by my interpretation.    COORDINATION OF CARE: 11:46 PM Discussed treatment plan with pt at bedside and pt agreed to plan.  Labs Review Labs Reviewed - No data to display  Imaging Review No results found. I have personally reviewed and evaluated these images and lab results as part of my medical decision-making.   EKG Interpretation None      MDM   Final diagnoses:  None   Keratosis Pilaris: exfoliate well hydrate and don't scratch.  Close follow up.  Strict return precautions  I personally performed the services described in this documentation, which was scribed in my presence. The recorded information has been reviewed and is accurate.     Cy Blamer, MD 03/04/16 [phone]

## 2016-03-03 NOTE — ED Notes (Signed)
Pt c/o ?insect bite to lower left abd x 5 days

## 2016-03-04 ENCOUNTER — Ambulatory Visit: Payer: No Typology Code available for payment source | Admitting: Physical Therapy

## 2016-03-06 ENCOUNTER — Ambulatory Visit: Payer: Medicaid Other

## 2016-03-24 ENCOUNTER — Other Ambulatory Visit: Payer: Self-pay | Admitting: Physician Assistant

## 2016-03-24 DIAGNOSIS — K219 Gastro-esophageal reflux disease without esophagitis: Secondary | ICD-10-CM

## 2016-03-24 DIAGNOSIS — R1310 Dysphagia, unspecified: Secondary | ICD-10-CM

## 2016-03-25 ENCOUNTER — Ambulatory Visit
Admission: RE | Admit: 2016-03-25 | Discharge: 2016-03-25 | Disposition: A | Discharge status: Home / Self Care | Payer: Medicaid Other | Source: Ambulatory Visit | Attending: Physician Assistant | Admitting: Physician Assistant

## 2016-03-25 DIAGNOSIS — R1310 Dysphagia, unspecified: Secondary | ICD-10-CM

## 2016-03-25 DIAGNOSIS — K219 Gastro-esophageal reflux disease without esophagitis: Secondary | ICD-10-CM

## 2016-04-10 ENCOUNTER — Institutional Professional Consult (permissible substitution): Payer: Medicaid Other | Admitting: Pulmonary Disease

## 2016-06-30 ENCOUNTER — Ambulatory Visit (INDEPENDENT_AMBULATORY_CARE_PROVIDER_SITE_OTHER): Payer: Medicaid Other | Admitting: Pulmonary Disease

## 2016-06-30 ENCOUNTER — Encounter: Payer: Self-pay | Admitting: Pulmonary Disease

## 2016-06-30 DIAGNOSIS — E669 Obesity, unspecified: Secondary | ICD-10-CM | POA: Diagnosis not present

## 2016-06-30 DIAGNOSIS — G4710 Hypersomnia, unspecified: Secondary | ICD-10-CM | POA: Insufficient documentation

## 2016-06-30 DIAGNOSIS — G4700 Insomnia, unspecified: Secondary | ICD-10-CM

## 2016-06-30 DIAGNOSIS — J4520 Mild intermittent asthma, uncomplicated: Secondary | ICD-10-CM | POA: Diagnosis not present

## 2016-06-30 DIAGNOSIS — J45909 Unspecified asthma, uncomplicated: Secondary | ICD-10-CM | POA: Insufficient documentation

## 2016-06-30 NOTE — Assessment & Plan Note (Signed)
Patient has hypersomnia, snoring, fatigue, witnessed apneas, poor sleep hygiene. She also has a 784-month-old and also takes care for other kids at the house.  Discussed about good sleep hygiene and addressing her insomnia. Once sleep hygiene are better, if her hypersomnia is persistent, plan to do a sleep study. Anticipate, she'll not have any issues with CPAP. Her stepdad and brother have OSA and use CPAP therapy.

## 2016-06-30 NOTE — Assessment & Plan Note (Signed)
Patient has had insomnia for several years now. Currently, she has a 8071-month-old and she sleeps from 4 am or 5 am until 1 pm, straight. She was on Ambien for several years but had to stop it when she got pregnant. She currently also needs to take  of 4 other children in the house and because of this, she needs to wake up at 6:30 in the morning. Prior to being pregnant and having other children in her house, she was having a hard time falling asleep and her sleep schedule will be all over the place.  We extensively  discussed good sleep hygiene:  5 Rules of Good Sleep Hygiene: 1. Go to bed only when sleepy. 2. Try to wake up at the same time everyday.  3. If you are not able to fall asleep within the hour, get up and do something boring and monotonous.  Continue doing this until you get sleepy and you fall asleep.  4. Avoid naps. 5. The bed should just be used for sleep and intimacy.    She plans on starting work in 1-2 months. She plans to work a 9am-5pm job.   I mentioned to her that she needs to try to fix her sleep schedule so that she ends up sleeping at night and waking up in the morning. By this, she'll be able to take care of her baby at night. 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  Suggest to hold off on sleep aids/meds for now.

## 2016-06-30 NOTE — Progress Notes (Signed)
Subjective:    Patient ID: Amber Wall, female    DOB: [birth year], 35 y.o.   MRN: [id number]  HPI   This is the case of Amber Wall, 35 y.o. Female, who was referred by Dr. Quitman Livings  in consultation regarding possible OSA.   As you very well know, patient Is a nonsmoker. She had asthma until she was 35 years old. She has been having bronchitis roughly once to 3 times a year to last 7 years. Usually that is triggered by change in weather. Gets better with antibiotics.  Patient's sleep schedule is all over the place. She currently has a 41-month-old baby. Her current schedule is such that she ends up going to bed at 4 5 in the morning. She sleeps straight until 4 PM. Her 55 month old baby also does the same thing. She has hypersomnia, snoring, gasping, choking, witnessed apneas.  She has had problems falling and staying asleep for several years now. She was on Ambien which was helping her but she had to stop it when she was pregnant.  Her schedule now is complicated by the fact that she has to take care of 5 children right now. She has a 71 year old daughter as well as a 50-month-old daughter. She recently got custody of her nieces/nephew and they are ages 38 years old, 28 years old, 7 years old. Her mom helps her taking care of the children. Because of other kids in the family now, she has to force herself to wake up 6:30 in the morning to prepare the kids for school.  She is currently on maternity leave but plans to start work in September or October. She plans to do a 9am to 5 pm job with [employer] or [employer].      Review of Systems  Constitutional: Negative.  Negative for fever and unexpected weight change.  HENT: Positive for congestion, postnasal drip, rhinorrhea and sinus pressure. Negative for dental problem, ear pain, nosebleeds, sneezing, sore throat and trouble swallowing.   Eyes: Negative.  Negative for redness and itching.  Respiratory: Positive for cough, shortness of breath  and wheezing. Negative for chest tightness.   Cardiovascular: Negative.  Negative for palpitations and leg swelling.  Gastrointestinal: Negative.  Negative for nausea and vomiting.  Endocrine: Negative.   Genitourinary: Negative.  Negative for dysuria.  Musculoskeletal: Negative.  Negative for joint swelling.  Skin: Negative.  Negative for rash.  Allergic/Immunologic: Positive for environmental allergies.  Neurological: Positive for headaches.  Hematological: Negative.  Does not bruise/bleed easily.  Psychiatric/Behavioral: Negative.  Negative for dysphoric mood. The patient is not nervous/anxious.    Past Medical History:  Diagnosis Date  . Bronchitis   . GERD (gastroesophageal reflux disease)   . Lactose intolerance   . Migraine   . Sinus headache    (-) CA, DVT  No family history on file.  Father is deceased and had heart dse. Mother has IBS, diverticuliutis.   Past Surgical History:  Procedure Laterality Date  . TONSILLECTOMY    . WISDOM TOOTH EXTRACTION      Social History   Social History  . Marital status: Single    Spouse name: N/A  . Number of children: N/A  . Years of education: N/A   Occupational History  . Not on file.   Social History Main Topics  . Smoking status: Never Smoker  . Smokeless tobacco: Never Used  . Alcohol use No  . Drug use: No  . Sexual activity: Yes  Birth control/ protection: None   Other Topics Concern  . Not on file   Social History Narrative  . No narrative on file     Allergies  Allergen Reactions  . Amoxicillin Rash    Has patient had a PCN reaction causing immediate rash, facial/tongue/throat swelling, SOB or lightheadedness with hypotension: Yes Has patient had a PCN reaction causing severe rash involving mucus membranes or skin necrosis: No Has patient had a PCN reaction that required hospitalization No Has patient had a PCN reaction occurring within the last 10 years: Yes If all of the above answers are "NO",  then may proceed with Cephalosporin use.     Outpatient Medications Prior to Visit  Medication Sig Dispense Refill  . acetaminophen (TYLENOL) 325 MG tablet Take 2 tablets (650 mg total) by mouth every 6 (six) hours as needed.    . ferrous sulfate 325 (65 FE) MG tablet Take 325 mg by mouth daily with breakfast.    . Prenatal Vit-Fe Fumarate-FA (PRENATAL MULTIVITAMIN) TABS tablet Take 1 tablet by mouth daily at 12 noon.    . ranitidine (ZANTAC) 150 MG tablet Take 150 mg by mouth 2 (two) times daily.    . cetirizine (ZYRTEC) 10 MG tablet Take 10 mg by mouth daily.      . Cetirizine HCl 10 MG TBDP Take 10 mg by mouth daily. 30 tablet 0   No facility-administered medications prior to visit.    Meds ordered this encounter  Medications  . albuterol (PROVENTIL HFA;VENTOLIN HFA) 108 (90 Base) MCG/ACT inhaler    Sig: Inhale 1-2 puffs into the lungs every 6 (six) hours as needed for wheezing or shortness of breath.  . loratadine (CLARITIN) 10 MG tablet    Sig: Take 10 mg by mouth daily.  . pantoprazole (PROTONIX) 40 MG tablet    Sig: Take 80 mg by mouth daily.  . busPIRone (BUSPAR) 15 MG tablet    Sig: Take 15 mg by mouth 3 (three) times daily.  Marland Kitchen. ibuprofen (ADVIL,MOTRIN) 800 MG tablet    Sig: Take 800 mg by mouth every 8 (eight) hours as needed.  . fluticasone (FLONASE) 50 MCG/ACT nasal spray    Sig: Place into both nostrils daily.  . Fluticasone-Salmeterol (ADVAIR) 250-50 MCG/DOSE AEPB    Sig: Inhale 1 puff into the lungs 2 (two) times daily.  Marland Kitchen. albuterol (ACCUNEB) 0.63 MG/3ML nebulizer solution    Sig: Take 1 ampule by nebulization every 6 (six) hours as needed for wheezing.  Marland Kitchen. albuterol (PROVENTIL) (2.5 MG/3ML) 0.083% nebulizer solution    Sig: Take 2.5 mg by nebulization every 6 (six) hours as needed for wheezing or shortness of breath.         Objective:   Physical Exam Vitals:  Vitals:   06/30/16 1557  BP: 122/82  Pulse: 86  SpO2: 96%  Weight: 253 lb (114.8 kg)  Height: 5'  1.75" (1.568 m)    Constitutional/General:  Pleasant, well-nourished, well-developed, not in any distress,  Comfortably seating.  Well kempt  Body mass index is 46.65 kg/m. Wt Readings from Last 3 Encounters:  06/30/16 253 lb (114.8 kg)  03/03/16 248 lb (112.5 kg)  12/28/15 262 lb 12.8 oz (119.2 kg)    Neck circumference: 18 in.   HEENT: Pupils equal and reactive to light and accommodation. Anicteric sclerae. Normal nasal mucosa.   No oral  lesions,  mouth clear,  oropharynx clear, no postnasal drip. (-) Oral thrush. No dental caries.  Airway - Mallampati class III  Neck: No masses. Midline trachea. No JVD, (-) LAD. (-) bruits appreciated.  Respiratory/Chest: Grossly normal chest. (-) deformity. (-) Accessory muscle use.  Symmetric expansion. (-) Tenderness on palpation.  Resonant on percussion.  Diminished BS on both lower lung zones. (-) wheezing, crackles, rhonchi (-) egophony  Cardiovascular: Regular rate and  rhythm, heart sounds normal, no murmur or gallops, no peripheral edema  Gastrointestinal:  Normal bowel sounds. Soft, non-tender. No hepatosplenomegaly.  (-) masses.   Musculoskeletal:  Normal muscle tone. Normal gait.   Extremities: Grossly normal. (-) clubbing, cyanosis.  (-) edema  Skin: (-) rash,lesions seen.   Neurological/Psychiatric : alert, oriented to time, place, person. Normal mood and affect            Assessment & Plan:  Insomnia Patient has had insomnia for several years now. Currently, she has a 49-month-old and she sleeps from 4 am or 5 am until 1 pm, straight. She was on Ambien for several years but had to stop it when she got pregnant. She currently also needs to take  of 4 other children in the house and because of this, she needs to wake up at 6:30 in the morning. Prior to being pregnant and having other children in her house, she was having a hard time falling asleep and her sleep schedule will be all over the place.  We  extensively  discussed good sleep hygiene:  5 Rules of Good Sleep Hygiene: 1. Go to bed only when sleepy. 2. Try to wake up at the same time everyday.  3. If you are not able to fall asleep within the hour, get up and do something boring and monotonous.  Continue doing this until you get sleepy and you fall asleep.  4. Avoid naps. 5. The bed should just be used for sleep and intimacy.    She plans on starting work in 1-2 months. She plans to work a 9am-5pm job.   I mentioned to her that she needs to try to fix her sleep schedule so that she ends up sleeping at night and waking up in the morning. By this, she'll be able to take care of her baby at night. 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  Suggest to hold off on sleep aids/meds for now.      Hypersomnia Patient has hypersomnia, snoring, fatigue, witnessed apneas, poor sleep hygiene. She also has a 92-month-old and also takes care for other kids at the house.  Discussed about good sleep hygiene and addressing her insomnia. Once sleep hygiene are better, if her hypersomnia is persistent, plan to do a sleep study. Anticipate, she'll not have any issues with CPAP. Her stepdad and brother have OSA and use CPAP therapy.  Asthma Stable. Alb prn.   OBESITY, NOS Weight reduction.    Thank you very much for letting me participate in this patient's care. Please do not hesitate to give me a call if you have any questions or concerns regarding the treatment plan.   Patient will follow up with me in 2 mos.     Pollie Meyer, MD 06/30/2016   5:24 PM Pulmonary and Critical Care Medicine [facility] Pager: [phone] Office: [phone], Fax: [phone]

## 2016-06-30 NOTE — Assessment & Plan Note (Signed)
Weight reduction 

## 2016-06-30 NOTE — Assessment & Plan Note (Signed)
Stable. Alb prn.

## 2016-06-30 NOTE — Patient Instructions (Signed)
It was a pleasure taking care of you today.  Let's plan on trying to fix your sleep schedule as we have discussed.  5 Rules of Good Sleep Hygiene: 1. Go to bed only when sleepy. 2. Try to wake up at the same time everyday.  3. If you are not able to fall asleep within the hour, get up and do something boring and monotonous.  Continue doing this until you get sleepy and you fall asleep.  4. Avoid naps. 5. The bed should just be used for sleep and intimacy.    Return to clinic in 2 mos with Dr. Christene Slatese Dios, sooner if you're symptomatic.

## 2016-08-27 ENCOUNTER — Ambulatory Visit: Payer: Medicaid Other | Admitting: Pulmonary Disease

## 2016-09-10 ENCOUNTER — Telehealth (HOSPITAL_COMMUNITY): Payer: Self-pay | Admitting: Lactation Services

## 2016-09-11 ENCOUNTER — Encounter (HOSPITAL_COMMUNITY)
Admission: RE | Admit: 2016-09-11 | Discharge: 2016-09-11 | Disposition: A | Discharge status: Home / Self Care | Payer: Medicaid Other | Source: Ambulatory Visit | Attending: Obstetrics & Gynecology | Admitting: Obstetrics & Gynecology

## 2016-09-12 NOTE — Lactation Note (Signed)
Lactation Consult  Mother'Wall reason for visit: Possible low supply Visit Type:  Lactation Appointment Notes:  None Consult:  Initial Lactation Consultant:  Huston FoleyMOULDEN, Amber Wall  ________________________________________________________________________  Pecola LeisureBabyBrandon Wall: Amber Wall DOB: [birth year] Birth weight: 6-4 Weight today: 15-2.3  Mother'Wall Name: Amber Wall Type of delivery:  vaginal Breastfeeding Experience:  Breastfed first baby for 4 months  Mom and 508 month old baby here for consult.  [facility] delivered at [facility].  Baby was full term and has been exclusively breastfed.  Mom concerned because baby is in the 11th percentile for growth.  She states the pediatrician mentioned it at their last visit but did not seem concerned.  [facility] and there has been no mention of growth problems.  Baby nurses every 2 hours and also eating some solids. Baby won't take a bottle but sometimes will drink from a cup.  Baby is very active and meeting developmental milestones.  She is crawling and pulling up.  Mom states she never stops moving.  Baby does a lot of nursing for short periods then pulling off to play.  Baby was distracted during consult and would only nurse for a few minutes at a time. We discussed ways to encourage solids and using cup more.  Suggested mom do some pumping about 4 times per day to boost supply.  Stressed importance of follow up with pediatrician.

## 2017-12-16 IMAGING — RF DG ESOPHAGUS
13 of 16 series · 19 of 24 positions shown · non-contrast
Comparison: Barium swallow of 08/04/2013

CLINICAL DATA: Dysphagia, reflux symptoms

EXAM:
ESOPHOGRAM / BARIUM SWALLOW / BARIUM TABLET STUDY
TECHNIQUE: Combined double contrast and single contrast examination performed
using effervescent crystals, thick barium liquid, and thin barium
liquid. The patient was observed with fluoroscopy swallowing a 13 mm
barium sulphate tablet.
FLUOROSCOPY TIME:  Radiation Exposure Index (as provided by the
fluoroscopic device): 100 deciGy per square cm
If the device does not provide the exposure index:
Fluoroscopy Time:  2 minutes 30 seconds
Number of Acquired Images:

[Series 2: run · 1 of 1 slices shown (1 of 13)]
[im 1/1]
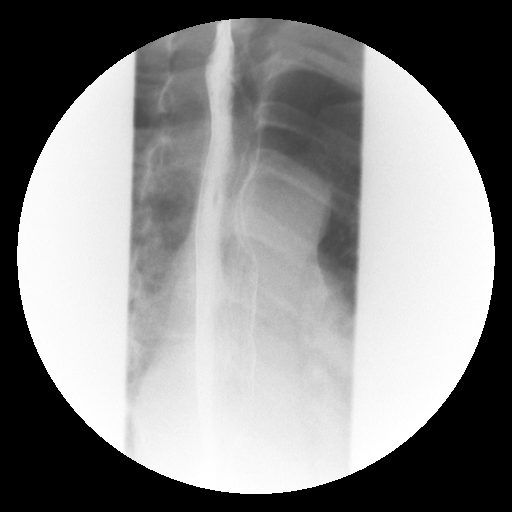

[Series 3: run · 1 of 1 slices shown (2 of 13)]
[im 1/1]
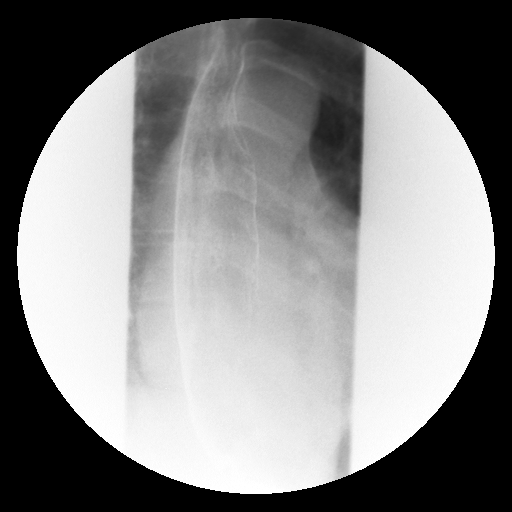

[Series 5: run · 1 of 1 slices shown (3 of 13)]
[im 1/1]
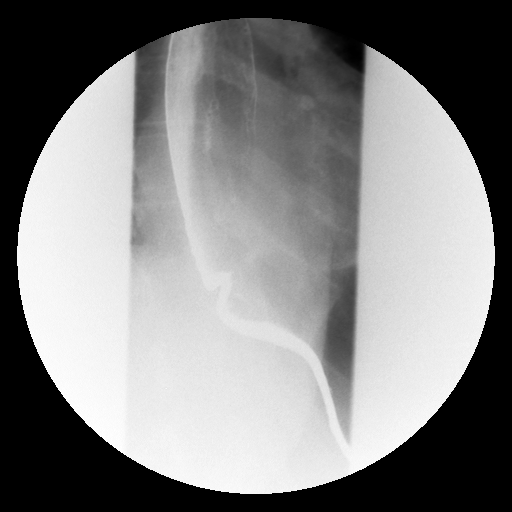

[Series 7: run · 1 of 1 slices shown (4 of 13)]
[im 1/1]
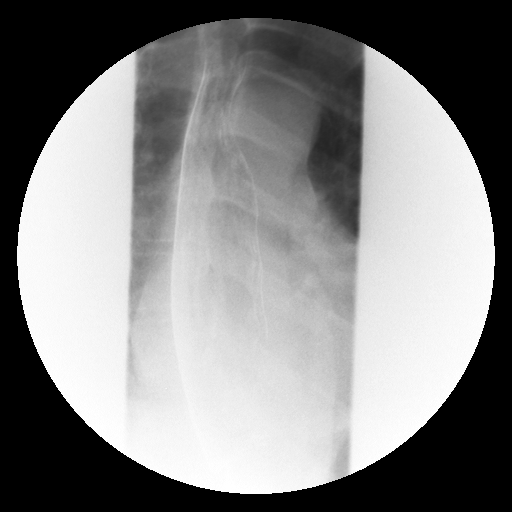

[Series 8: run · 3 of 8 slices shown (5 of 13)]
[im 1/8]
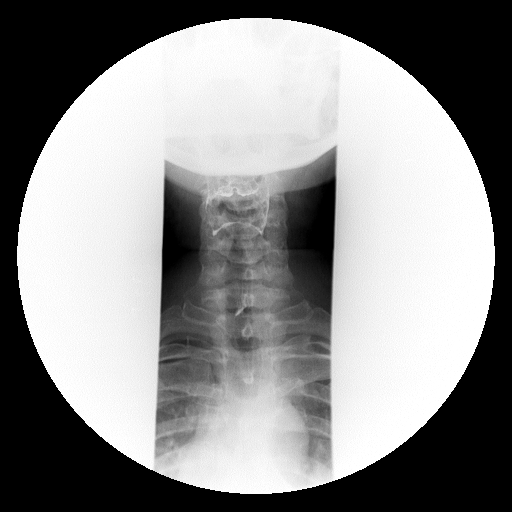
[im 3/8]
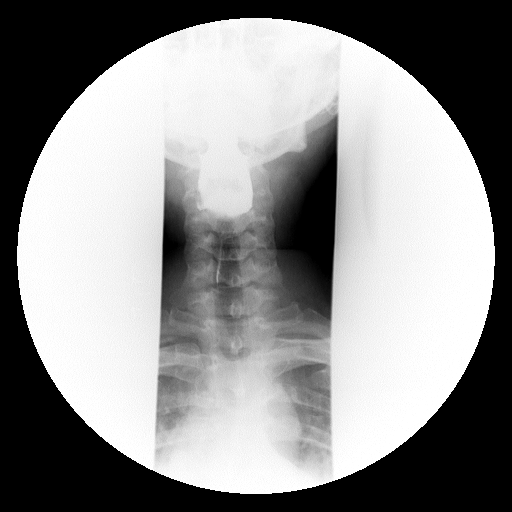
[im 8/8]
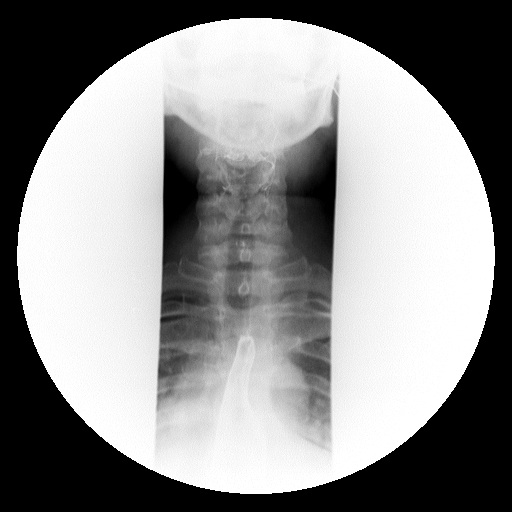

[Series 9: run · 5 of 9 slices shown (6 of 13)]
[im 1/9]
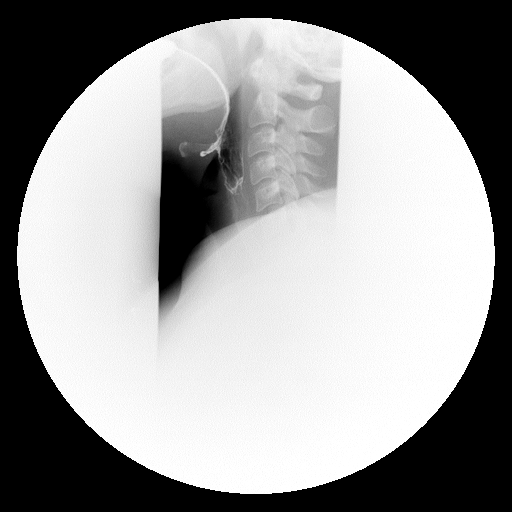
[im 2/9]
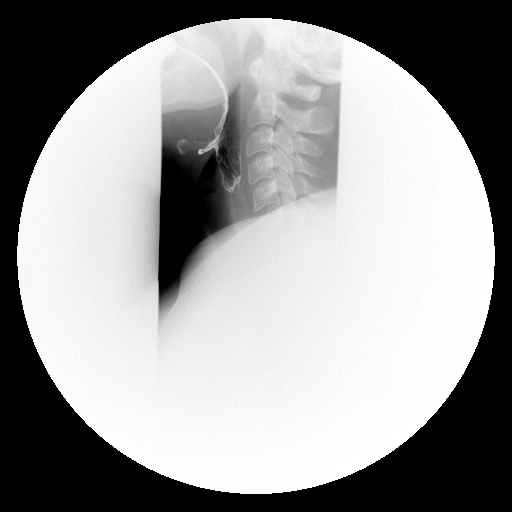
[im 5/9]
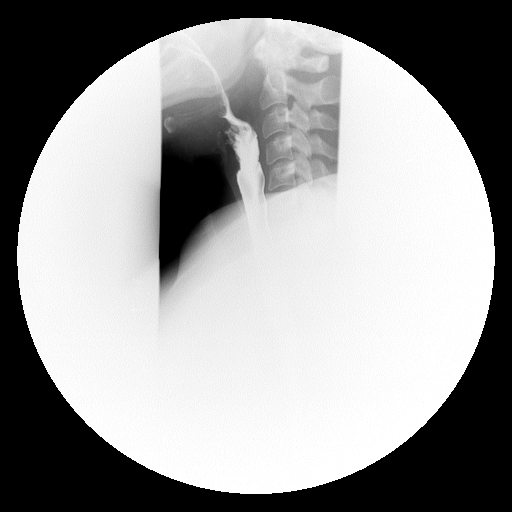
[im 7/9]
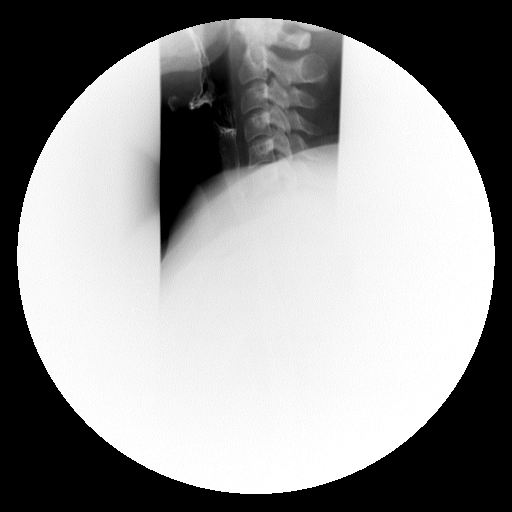
[im 9/9]
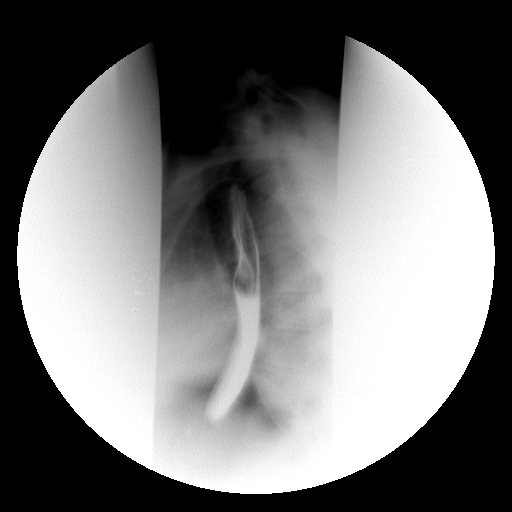

[Series 10: run · 1 of 1 slices shown (7 of 13)]
[im 1/1]
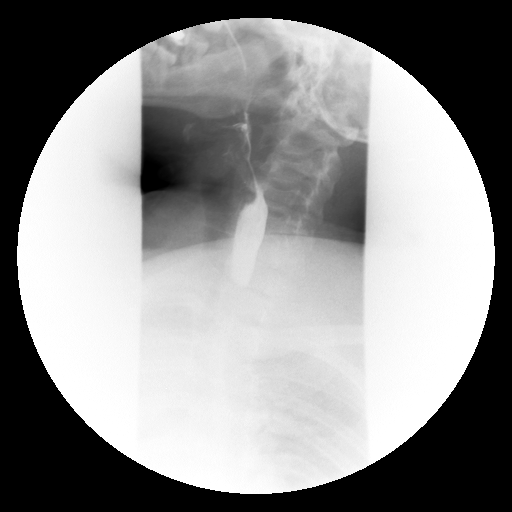

[Series 13: run · 1 of 1 slices shown (8 of 13)]
[im 1/1]
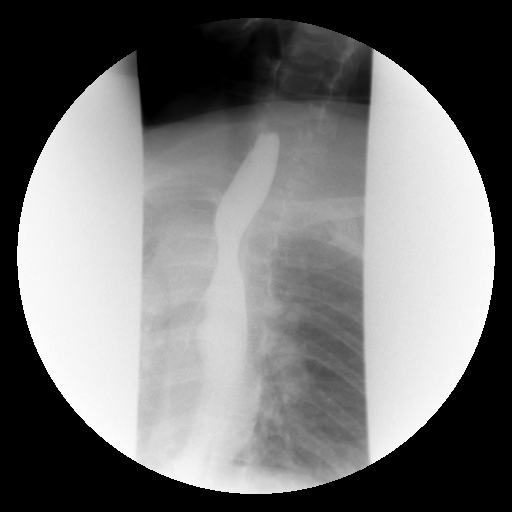

[Series 14: run · 1 of 1 slices shown (9 of 13)]
[im 1/1]
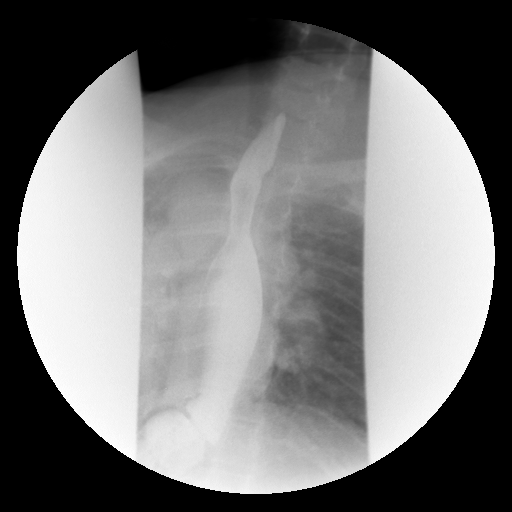

[Series 15: run · 1 of 1 slices shown (10 of 13)]
[im 1/1]
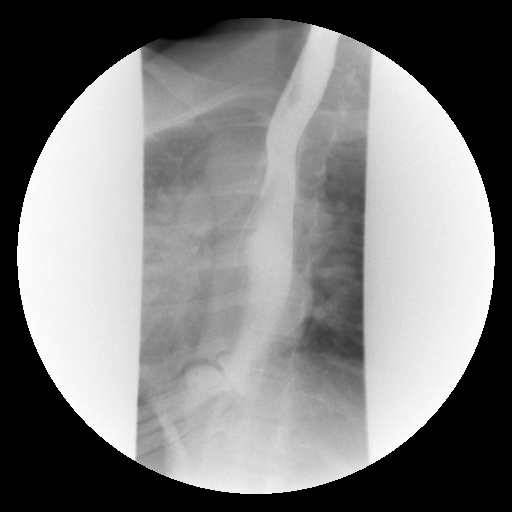

[Series 16: run · 1 of 1 slices shown (11 of 13)]
[im 1/1]
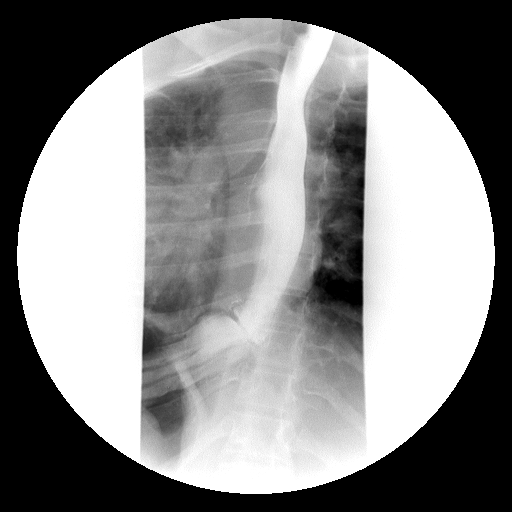

[Series 20: run · 1 of 1 slices shown (12 of 13)]
[im 1/1]
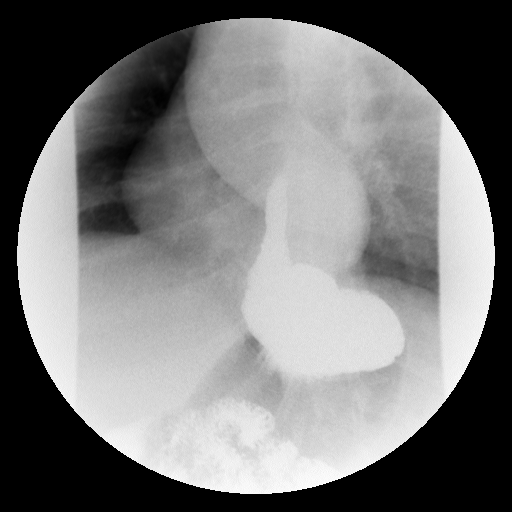

[Series 21: run · 1 of 1 slices shown (13 of 13)]
[im 1/1]
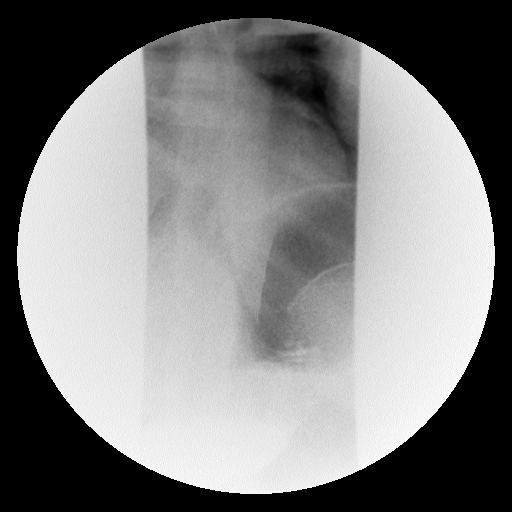

[19 of 24 positions shown; findings below may reference images not displayed]

FINDINGS: A double-contrast barium swallow was performed. The mucosa of the
esophagus is unremarkable. A single contrast study shows the
swallowing mechanism to be unremarkable. Esophageal peristalsis is
normal. There is a small sliding hiatal hernia again noted which has
not changed significantly in size compared to the study from 1811.
Mild to moderate gastroesophageal reflux is noted. A barium pill was
given at the end of the study which passed into the stomach without
delay.
IMPRESSION: 1. Stable small sliding hiatal hernia compared to barium swallow
from 08/04/2013.
2. Barium pill passes into the stomach without delay.
3. Mild to moderate gastroesophageal reflux.

## 2018-04-08 ENCOUNTER — Ambulatory Visit: Payer: Medicaid Other | Admitting: Obstetrics and Gynecology

## 2018-04-08 ENCOUNTER — Encounter: Payer: Self-pay | Admitting: Obstetrics and Gynecology

## 2018-04-08 ENCOUNTER — Other Ambulatory Visit (HOSPITAL_COMMUNITY)
Admission: RE | Admit: 2018-04-08 | Discharge: 2018-04-08 | Disposition: A | Discharge status: Home / Self Care | Payer: Medicaid Other | Source: Ambulatory Visit | Attending: Obstetrics and Gynecology | Admitting: Obstetrics and Gynecology

## 2018-04-08 VITALS — BP 108/74 | HR 73 | Ht 61.75 in | Wt 245.5 lb

## 2018-04-08 DIAGNOSIS — Z124 Encounter for screening for malignant neoplasm of cervix: Secondary | ICD-10-CM | POA: Diagnosis present

## 2018-04-08 DIAGNOSIS — Z789 Other specified health status: Secondary | ICD-10-CM

## 2018-04-08 DIAGNOSIS — Z0000 Encounter for general adult medical examination without abnormal findings: Secondary | ICD-10-CM | POA: Diagnosis not present

## 2018-04-08 DIAGNOSIS — Z01419 Encounter for gynecological examination (general) (routine) without abnormal findings: Secondary | ICD-10-CM | POA: Diagnosis not present

## 2018-04-08 DIAGNOSIS — Z3009 Encounter for other general counseling and advice on contraception: Secondary | ICD-10-CM

## 2018-04-08 DIAGNOSIS — F419 Anxiety disorder, unspecified: Secondary | ICD-10-CM

## 2018-04-08 NOTE — Progress Notes (Signed)
Pt presents for annual, pap, and all STD testing. Pt currently breast feeding c/o cracked nipple in R breast.

## 2018-04-08 NOTE — Progress Notes (Signed)
GYNECOLOGY ANNUAL PREVENTATIVE CARE ENCOUNTER NOTE  Subjective:   Amber Wall is a 37 y.o. 523P2012 female here for a annual gynecologic exam. Current complaints: occasional razor burn in groin crease. Better now that she has stopped shaving. Also with a cracked nipple and significant cramping during periods. Still breastfeeding, has not had regular periods in two years, has had a couple of regular periods as she is trying to stop breastfeeding.   Was on nuvaring in past, worked well for her and controlled her cramping.  Desires to restart Nuvaring as her periods have started to come back, are still irregular and she is getting significant cramping.   Patient still breast-feeding and strongly desires to stop. Reports this is a significant stress for her as she cannot be away from infant for long periods of time. States her daughter simply will not wean. She would like to go back to work as well. States her mother cares for infant and this relationship is also a source of stress.   Gynecologic History Patient's last menstrual period was 03/31/2018. Contraception: none Last Pap: 2017. Results were: normal Last mammogram: n/a  Obstetric History OB History  Gravida Para Term Preterm AB Living  3 2 2   1 2   SAB TAB Ectopic Multiple Live Births    1     2    # Outcome Date GA Lbr Len/2nd Weight Sex Delivery Anes PTL Lv  3 Term 01/03/16 6023w0d   F Vag-Spont EPI  LIV  2 Term 02/07/04 [id number]   F Vag-Spont EPI  LIV  1 TAB 02/2000            Past Medical History:  Diagnosis Date  . Anxiety   . Bronchitis   . GERD (gastroesophageal reflux disease)   . Lactose intolerance   . Migraine   . Sinus headache   hasn't had migraines in a while, denies history of aura  Past Surgical History:  Procedure Laterality Date  . TONSILLECTOMY    . WISDOM TOOTH EXTRACTION      Current Outpatient Medications on File Prior to Visit  Medication Sig Dispense Refill  . albuterol (PROVENTIL  HFA;VENTOLIN HFA) 108 (90 Base) MCG/ACT inhaler Inhale 1-2 puffs into the lungs every 6 (six) hours as needed for wheezing or shortness of breath.    Marland Kitchen. albuterol (PROVENTIL) (2.5 MG/3ML) 0.083% nebulizer solution Take 2.5 mg by nebulization every 6 (six) hours as needed for wheezing or shortness of breath.    . pantoprazole (PROTONIX) 40 MG tablet Take 80 mg by mouth daily.    . phentermine 37.5 MG capsule Take 37.5 mg by mouth every morning.    . ranitidine (ZANTAC) 150 MG tablet Take 2 tabs by mouth twice a day    . ibuprofen (ADVIL,MOTRIN) 800 MG tablet Take 800 mg by mouth every 8 (eight) hours as needed.    . [DISCONTINUED] ipratropium (ATROVENT) 0.06 % nasal spray Place 2 sprays into both nostrils 4 (four) times daily. (Patient not taking: Reported on 09/27/2014) 15 mL 1   No current facility-administered medications on file prior to visit.     Allergies  Allergen Reactions  . Amoxicillin Rash    Has patient had a PCN reaction causing immediate rash, facial/tongue/throat swelling, SOB or lightheadedness with hypotension: Yes Has patient had a PCN reaction causing severe rash involving mucus membranes or skin necrosis: No Has patient had a PCN reaction that required hospitalization No Has patient had a PCN reaction occurring within  the last 10 years: Yes If all of the above answers are "NO", then may proceed with Cephalosporin use.    Social History   Socioeconomic History  . Marital status: Single    Spouse name: Not on file  . Number of children: Not on file  . Years of education: Not on file  . Highest education level: Not on file  Occupational History  . Not on file  Social Needs  . Financial resource strain: Not on file  . Food insecurity:    Worry: Not on file    Inability: Not on file  . Transportation needs:    Medical: Not on file    Non-medical: Not on file  Tobacco Use  . Smoking status: Never Smoker  . Smokeless tobacco: Never Used  Substance and Sexual  Activity  . Alcohol use: No  . Drug use: No  . Sexual activity: Yes    Partners: Male    Birth control/protection: None  Lifestyle  . Physical activity:    Days per week: Not on file    Minutes per session: Not on file  . Stress: Not on file  Relationships  . Social connections:    Talks on phone: Not on file    Gets together: Not on file    Attends religious service: Not on file    Active member of club or organization: Not on file    Attends meetings of clubs or organizations: Not on file    Relationship status: Not on file  . Intimate partner violence:    Fear of current or ex partner: Not on file    Emotionally abused: Not on file    Physically abused: Not on file    Forced sexual activity: Not on file  Other Topics Concern  . Not on file  Social History Narrative  . Not on file    Family History  Problem Relation Age of Onset  . Diverticulitis Mother   . Irritable bowel syndrome Mother   . Lactose intolerance Mother   . Migraines Mother   . Heart disease Father   . Diabetes Father   . Aneurysm Maternal Grandmother   . Dementia Maternal Grandfather   . Heart disease Maternal Grandfather    The following portions of the patient's history were reviewed and updated as appropriate: allergies, current medications, past family history, past medical history, past social history, past surgical history and problem list.  Review of Systems Pertinent items are noted in HPI.   Objective:  BP 108/74   Pulse 73   Ht 5' 1.75" (1.568 m)   Wt 245 lb 8 oz (111.4 kg)   LMP 03/31/2018   BMI 45.27 kg/m  CONSTITUTIONAL: Well-developed, well-nourished female in no acute distress.  HENT:  Normocephalic, atraumatic, External right and left ear normal. Oropharynx is clear and moist EYES: Conjunctivae and EOM are normal. Pupils are equal, round, and reactive to light. No scleral icterus.  NECK: Normal range of motion, supple, no masses.  Normal thyroid.  SKIN: Skin is warm and  dry. No rash noted. Not diaphoretic. No erythema. No pallor. NEUROLOGIC: Alert and oriented to person, place, and time. Normal reflexes, muscle tone coordination. No cranial nerve deficit noted. PSYCHIATRIC: Normal mood and affect. Normal behavior. Normal judgment and thought content. CARDIOVASCULAR: Normal heart rate noted, regular rhythm RESPIRATORY: Clear to auscultation bilaterally. Effort and breath sounds normal, no problems with respiration noted. BREASTS: Symmetric in size. No masses, skin changes, nipple drainage, or lymphadenopathy. Nipple  crack visible in right nipple but appears to be healing well ABDOMEN: Soft, normal bowel sounds, no distention noted.  No tenderness, rebound or guarding.  PELVIC: Normal appearing external genitalia; normal appearing vaginal mucosa and cervix.  No abnormal discharge noted.  Pap smear obtained.  Normal uterine size, no other palpable masses, no uterine or adnexal tenderness. MUSCULOSKELETAL: Normal range of motion. No tenderness.  No cyanosis, clubbing, or edema.  2+ distal pulses.  Assessment and Plan:   1. Well woman exam Benign exam  2. Screening for malignant neoplasm of cervix - Cytology - PAP  3. Anxiety Patient tearful on interview describing her relationship with her mother and inability to stop breastfeeding. She has taken multiple different medications in the past for depression and anxiety for short periods of time to get through difficult periods of her life and would like to restart today. Has taken zoloft in past to good effect, is agreeable to restarting. Declines to see behavioral health. Reviewed importance of tapering up dose and then tapering off under medical supervision as she states she has taken herself off meds in the past.  4. Breastfeeding (infant) - Reviewed suggestions for weaning infant, suggested she also speak with pediatrician for strategies. - Patient also taking Phentermine intermittently, reviewed this is not  considered a safe medication while breastfeeding and recommended she stop until she is completely done breastfeeding, patient verbalizes understanding.  - Reviewed risks of Nuvaring and Zoloft while breastfeeding, that Zoloft is generally considered safe but complete profile unknown, she verbalizes understanding. She will also transition management of anxiety/depression meds to PCP  5. Encounter for counseling regarding contraception - Briefly reviewed options for contraception, however she is happy with nuvaring in the past, prescription sent to restart - Will call if desires another method for contraception   Will follow up results of pap smear/STI screen and manage accordingly. Encouraged improvement in diet and exercise.    Routine preventative health maintenance measures emphasized. Please refer to After Visit Summary for other counseling recommendations.     Baldemar Lenis, M.D. Attending Obstetrician & Gynecologist, [facility] for [employer], [facility]

## 2018-04-09 ENCOUNTER — Encounter: Payer: Self-pay | Admitting: Obstetrics and Gynecology

## 2018-04-09 MED ORDER — SERTRALINE HCL 50 MG PO TABS: 50.0000 mg | ORAL_TABLET | Freq: Every day | ORAL | 2 refills | Status: DC

## 2018-04-09 MED ORDER — ETONOGESTREL-ETHINYL ESTRADIOL 0.12-0.015 MG/24HR VA RING: VAGINAL_RING | VAGINAL | 12 refills | Status: DC

## 2018-04-13 LAB — CYTOLOGY - PAP
Chlamydia: NEGATIVE
DIAGNOSIS: NEGATIVE
HPV: NOT DETECTED
Neisseria Gonorrhea: NEGATIVE
Trichomonas: NEGATIVE

## 2018-05-03 ENCOUNTER — Telehealth: Payer: Self-pay

## 2018-05-03 NOTE — Telephone Encounter (Signed)
Pt called requesting for a refill for nystatin and diflucan. She states that she has yeast under her breast. Please review for refill. Pt also states her NuvaRing with this last cycle, but she has been bleeding for 9 days since starting this. She wants to know if this is normal, or if something else needs to be done.

## 2018-05-04 ENCOUNTER — Other Ambulatory Visit: Payer: Self-pay | Admitting: Obstetrics and Gynecology

## 2018-05-04 ENCOUNTER — Telehealth: Payer: Self-pay

## 2018-05-04 MED ORDER — FLUCONAZOLE 150 MG PO TABS: 150.0000 mg | ORAL_TABLET | Freq: Once | ORAL | 0 refills | Status: AC

## 2018-05-04 NOTE — Telephone Encounter (Signed)
-----   Message from Conan BowensKelly M Davis, MD sent at 05/04/2018  4:22 PM EDT ----- Please advise patient diflucan x1 dose sent to pharmacy, if breast candidiasis is ongoing issue, she should make appointment to be seen.

## 2018-05-04 NOTE — Telephone Encounter (Signed)
Called pt and let her know that Dr. Earlene Plateravis sent one more dose of Diflucan to her pharmacy. Advised pt that if symptoms do not improve in the next 24-48 hours to call to be seen for an appointment. Pt verbalized understanding.

## 2018-05-04 NOTE — Progress Notes (Signed)
Diflucan sent to pharmacy for breast candidiasis.

## 2018-05-07 ENCOUNTER — Telehealth: Payer: Self-pay

## 2018-05-07 NOTE — Telephone Encounter (Signed)
Pt states that she is having issues with yeast under her breast, and on her nipple. She states that the yeast started out on her right nipple, but has now spread to her left breast. She also has some red bumps under her right breast. She states that her nipples are cracked. Pt states that she thinks her baby may now have thrush. She has contacted the pediatrician to get baby treated.

## 2018-05-25 ENCOUNTER — Ambulatory Visit: Payer: Medicaid Other | Admitting: Physical Therapy

## 2018-06-01 ENCOUNTER — Ambulatory Visit: Payer: Medicaid Other | Admitting: Physical Therapy

## 2018-06-08 ENCOUNTER — Ambulatory Visit: Payer: Medicaid Other | Admitting: Sports Medicine

## 2018-06-08 ENCOUNTER — Encounter: Payer: Self-pay | Admitting: Physical Therapy

## 2018-06-08 ENCOUNTER — Other Ambulatory Visit: Payer: Self-pay

## 2018-06-08 ENCOUNTER — Ambulatory Visit (INDEPENDENT_AMBULATORY_CARE_PROVIDER_SITE_OTHER): Payer: Medicaid Other

## 2018-06-08 ENCOUNTER — Ambulatory Visit: Payer: Medicaid Other | Attending: Orthopedic Surgery | Admitting: Physical Therapy

## 2018-06-08 ENCOUNTER — Other Ambulatory Visit: Payer: Self-pay | Admitting: Sports Medicine

## 2018-06-08 ENCOUNTER — Encounter: Payer: Self-pay | Admitting: Sports Medicine

## 2018-06-08 DIAGNOSIS — M79671 Pain in right foot: Secondary | ICD-10-CM

## 2018-06-08 DIAGNOSIS — M779 Enthesopathy, unspecified: Secondary | ICD-10-CM

## 2018-06-08 DIAGNOSIS — M25571 Pain in right ankle and joints of right foot: Secondary | ICD-10-CM | POA: Insufficient documentation

## 2018-06-08 DIAGNOSIS — M7751 Other enthesopathy of right foot: Secondary | ICD-10-CM | POA: Diagnosis not present

## 2018-06-08 DIAGNOSIS — G8929 Other chronic pain: Secondary | ICD-10-CM | POA: Diagnosis present

## 2018-06-08 DIAGNOSIS — R262 Difficulty in walking, not elsewhere classified: Secondary | ICD-10-CM | POA: Insufficient documentation

## 2018-06-08 DIAGNOSIS — M25561 Pain in right knee: Secondary | ICD-10-CM | POA: Insufficient documentation

## 2018-06-08 DIAGNOSIS — R2689 Other abnormalities of gait and mobility: Secondary | ICD-10-CM

## 2018-06-08 DIAGNOSIS — M2141 Flat foot [pes planus] (acquired), right foot: Secondary | ICD-10-CM

## 2018-06-08 DIAGNOSIS — M2142 Flat foot [pes planus] (acquired), left foot: Secondary | ICD-10-CM | POA: Diagnosis not present

## 2018-06-08 DIAGNOSIS — M545 Low back pain: Secondary | ICD-10-CM | POA: Insufficient documentation

## 2018-06-08 DIAGNOSIS — M6281 Muscle weakness (generalized): Secondary | ICD-10-CM | POA: Diagnosis present

## 2018-06-08 DIAGNOSIS — M25671 Stiffness of right ankle, not elsewhere classified: Secondary | ICD-10-CM | POA: Diagnosis present

## 2018-06-08 MED ORDER — TRIAMCINOLONE ACETONIDE 10 MG/ML IJ SUSP: 10.0000 mg | Freq: Once | INTRAMUSCULAR | Status: AC

## 2018-06-08 MED ORDER — DICLOFENAC EPOLAMINE 1.3 % TD PTCH: 1.0000 | MEDICATED_PATCH | Freq: Two times a day (BID) | TRANSDERMAL | 1 refills | Status: DC
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
## 2018-06-09 ENCOUNTER — Telehealth: Payer: Self-pay | Admitting: *Deleted

## 2018-06-09 DIAGNOSIS — M7671 Peroneal tendinitis, right leg: Secondary | ICD-10-CM

## 2018-06-09 NOTE — Telephone Encounter (Deleted)
-----   Message from Titorya Stover, DPM sent at 06/08/2018 12:10 PM EDT ----- Regarding: Add on at PT Patient is currently getting PT for her knee at med center in [place] can you send order to add on PT for her peroneal tendonitis on right foot to help reduce pain, inflammation, strengthening, and gait training Thanks Dr. Stover  

## 2018-06-09 NOTE — Telephone Encounter (Signed)
-----   Message from Amber Wall, [place] sent at 06/08/2018 12:10 PM EDT ----- Regarding: Add on at PT Patient is currently getting PT for her knee at med center in [place] can you send order to add on PT for her peroneal tendonitis on right foot to help reduce pain, inflammation, strengthening, and gait training Thanks Dr. Marylene LandStover

## 2018-06-09 NOTE — Telephone Encounter (Signed)
Viona GilmoreSue - [facility] [place] states she works the [employer] and did get my message concerning pt's additional PT.

## 2018-06-09 NOTE — Telephone Encounter (Signed)
Left message requesting information concerning referral mode to [facility] [place].

## 2018-06-14 ENCOUNTER — Ambulatory Visit: Payer: Medicaid Other | Admitting: Physical Therapy

## 2018-06-14 ENCOUNTER — Encounter: Payer: Self-pay | Admitting: Physical Therapy

## 2018-06-14 DIAGNOSIS — M25561 Pain in right knee: Principal | ICD-10-CM

## 2018-06-14 DIAGNOSIS — R2689 Other abnormalities of gait and mobility: Secondary | ICD-10-CM

## 2018-06-14 DIAGNOSIS — G8929 Other chronic pain: Secondary | ICD-10-CM

## 2018-06-14 DIAGNOSIS — R262 Difficulty in walking, not elsewhere classified: Secondary | ICD-10-CM

## 2018-06-14 DIAGNOSIS — M6281 Muscle weakness (generalized): Secondary | ICD-10-CM

## 2018-06-14 NOTE — Therapy (Addendum)
[facility] [address]  [address] [place], [place], [address] Phone: [phone]   Fax:  [phone]  Physical Therapy Treatment  Patient Details  Name: Amber Wall MRN: [id number] Date of Birth: 1981/05/19 Referring Provider: Gean Birchwood, MD   Encounter Date: 06/14/2018  PT End of Session - 06/14/18 0947    Visit Number  2    Number of Visits  4    Date for PT Re-Evaluation  06/22/18    Authorization Type  Medicaid    Authorization Time Period  06/09/2018-06/22/2018    Authorization - Visit Number  1    Authorization - Number of Visits  3    PT Start Time  0940   Session started late due to pt arriving late   PT Stop Time  1021    PT Time Calculation (min)  41 min    Activity Tolerance  Patient tolerated treatment well    Behavior During Therapy  [facility] for tasks assessed/performed       Past Medical History:  Diagnosis Date  . Anxiety   . Bronchitis   . GERD (gastroesophageal reflux disease)   . Lactose intolerance   . Migraine   . Sinus headache     Past Surgical History:  Procedure Laterality Date  . TONSILLECTOMY    . WISDOM TOOTH EXTRACTION      There were no vitals filed for this visit.  Subjective Assessment - 06/14/18 1006    Subjective  Pt reports that she is well today with no new complaints. Has noticed pain in her knee since last week when ascending a curb, but other than that has not had a signifcant change in status. Reports that returning to work and standing for long periods of time has not been an issue for her at this time.     Limitations  Walking;Sitting    Patient Stated Goals  want the pain to go out of the knee; be able to go out dancing without the knee hurting    Currently in Pain?  No/denies    Pain Score  0-No pain                       [facility] Adult PT Treatment/Exercise - 06/14/18 0001      Self-Care   Self-Care  Retrograde Massage    Retrograde Massage  Pt  instructed in how to use tennis ball or similar object at home to address trigger points in R glute and hip ER musculature, as well as use of a rolling pin to address taut bands in hamstrings and R medial gastroc      Exercises   Exercises  Knee/Hip      Knee/Hip Exercises: Stretches   Passive Hamstring Stretch  Left;3 reps;30 seconds    Passive Hamstring Stretch Limitations  With stretch strap around forefoot for incorporate gastroc stretch. VCs required to prevent genu recurvatum during exercise.       Knee/Hip Exercises: Aerobic   Recumbent Bike  L1 x 6 min      Knee/Hip Exercises: Standing   Other Standing Knee Exercises  Lateral stepping with Red TB around forefoot; 2 x 10 reps in each direction; VC to prevent genu recurvatum with movement      Manual Therapy   Manual Therapy  Soft tissue mobilization    Manual therapy comments  Prone    Soft tissue mobilization  To R hamstring musculature, R hip  ER group, and R medial gastroc. Pt noting good relief with STM and PT noticed increase in soft tissue extensbility.     Myofascial Release  --               PT Short Term Goals - 06/14/18 1249      PT SHORT TERM GOAL #1   Title  Pt will be independent with initial HEP    Status  On-going      PT SHORT TERM GOAL #2   Title  Pt will report pain no greater than in the R knee 5/10 with activity including squating    Status  On-going      PT SHORT TERM GOAL #3   Title  Pt will demonstrate improved squatting mechanics to decrease risk for further injury to knee joint    Status  On-going        PT Long Term Goals - 06/14/18 1249      PT LONG TERM GOAL #1   Title  Patient will be independent with advanced HEP    Status  On-going      PT LONG TERM GOAL #2   Title  Pt will report pain no greater than 3/10 in the R knee with activity to improve tolerance to squatting and ADLs including lifting her daughter    Status  On-going      PT LONG TERM GOAL #3   Title  Pt will  demonstrate normal gait pattern with no compensations due to pain to decrease risk for future injury and decrease pain in R foot    Status  On-going            Plan - 06/14/18 1245    Clinical Impression Statement  [place] did well during treatment session today focusing on addressing muscle lengthening through manual therapy, as well as addressing multiple trigger points in R posterior chain musculature. During hamstring stretching in supine today pt demonstrated significant genu recurvatum of knee, and was instructed to prevent this motion at the knee during standing, and how this can be contributing to her current symptoms. She will continue to benefit from physical therapy to increase flexibility, muscle strength, and progress toward her functional goals.     PT Treatment/Interventions  ADLs/Self Care Home Management;Cryotherapy;Electrical Stimulation;Iontophoresis 4mg /ml Dexamethasone;Moist Heat;Gait training;Stair training;Functional mobility training;Therapeutic activities;Therapeutic exercise;Ultrasound;Balance training;Neuromuscular re-education;Patient/family education;Orthotic Fit/Training;Manual techniques;Dry needling;Taping;Vasopneumatic Device    Consulted and Agree with Plan of Care  Patient       Patient will benefit from skilled therapeutic intervention in order to improve the following deficits and impairments:  Abnormal gait, Decreased activity tolerance, Decreased strength, Pain, Increased muscle spasms, Improper body mechanics, Impaired flexibility, Postural dysfunction, Decreased mobility, Difficulty walking  Visit Diagnosis: Chronic pain of right knee  Other abnormalities of gait and mobility  Difficulty in walking, not elsewhere classified  Muscle weakness (generalized)     Problem List Patient Active Problem List   Diagnosis Date Noted  . Insomnia 06/30/2016  . Hypersomnia 06/30/2016  . Asthma 06/30/2016  . Vaginal delivery 01/03/2016  . Moderate  persistent asthma with acute exacerbation 11/17/2014  . Pain in soft tissues of limb 02/22/2014  . Problems influencing health status 01/04/2014  . Hemorrhoids 11/24/2013  . Ganglion 07/30/2013  . Anemia 04/04/2013  . Carpal tunnel syndrome 04/04/2013  . Surveillance for birth control, oral contraceptives 04/04/2013  . OBESITY, NOS 12/31/2006  . MIGRAINE, UNSPEC., W/O INTRACTABLE MIGRAINE 12/31/2006  . RHINITIS, ALLERGIC 12/31/2006  . GASTROESOPHAGEAL REFLUX, NO  ESOPHAGITIS 12/31/2006    Mikal PlaneJenna Ellorie Kindall, SPT  06/14/2018, 3:03 PM  [facility] [place] [address]  [address] [place], [place], [address] Phone: [phone]   Fax:  [phone]  Name: Gordy SaversDwan A Peters MRN: [id number] Date of Birth: Feb 04, 1981

## 2018-06-15 ENCOUNTER — Encounter: Payer: Medicaid Other | Admitting: Physical Therapy

## 2018-06-16 ENCOUNTER — Ambulatory Visit: Payer: Medicaid Other | Admitting: Physical Therapy

## 2018-06-16 DIAGNOSIS — G8929 Other chronic pain: Secondary | ICD-10-CM

## 2018-06-16 DIAGNOSIS — M25561 Pain in right knee: Secondary | ICD-10-CM | POA: Diagnosis not present

## 2018-06-16 DIAGNOSIS — R262 Difficulty in walking, not elsewhere classified: Secondary | ICD-10-CM

## 2018-06-16 DIAGNOSIS — R2689 Other abnormalities of gait and mobility: Secondary | ICD-10-CM

## 2018-06-16 DIAGNOSIS — M6281 Muscle weakness (generalized): Secondary | ICD-10-CM

## 2018-06-16 NOTE — Therapy (Signed)
[facility] [address]  [address] [place], [place], [address] Phone: [phone]   Fax:  [phone]  Physical Therapy Treatment  Patient Details  Name: Amber Wall MRN: [id number] Date of Birth: [birth year] Referring Provider: Dr. Gean BirchwoodFrank Rowan    Encounter Date: 06/16/2018  PT End of Session - 06/16/18 0852    Visit Number  3    Number of Visits  4    Date for PT Re-Evaluation  06/22/18    Authorization Type  Medicaid    Authorization - Visit Number  2    Authorization - Number of Visits  3    PT Start Time  [phone]   pt arrived late   PT Stop Time  0932    PT Time Calculation (min)  40 min    Activity Tolerance  Patient tolerated treatment well    Behavior During Therapy  [facility] for tasks assessed/performed       Past Medical History:  Diagnosis Date  . Anxiety   . Bronchitis   . GERD (gastroesophageal reflux disease)   . Lactose intolerance   . Migraine   . Sinus headache     Past Surgical History:  Procedure Laterality Date  . TONSILLECTOMY    . WISDOM TOOTH EXTRACTION      There were no vitals filed for this visit.  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  She has some questions about HEP, "I'm not sure I'm doing it correctly".  She feels a little worse than last visit due to working 8 hrs yesterday.     Patient Stated Goals  want the pain to go out of the knee; be able to go out dancing without the knee hurting    Currently in Pain?  Yes    Pain Score  3     Pain Location  Hip       [facility] Adult PT Treatment/Exercise - 06/16/18 0001      Self-Care   Self-Care  Other Self-Care Comments    Other Self-Care Comments   pt encouraged to avoid locking knees when standing, especially at work.       Exercises   Exercises  Knee/Hip      Knee/Hip Exercises: Stretches   Passive Hamstring Stretch  Right;Left;2  reps;30 seconds   supine with strap, avoiding hyperextension   Quad Stretch  Right;Left;2 reps;30 seconds   prone with strap   Piriformis Stretch  Right;Left;3 reps;30 seconds   supine, 2 reps; seated 1 rep   Gastroc Stretch  Right;Left;2 reps;30 seconds      Knee/Hip Exercises: Aerobic   Tread Mill  1.8 mph x 5 min       Knee/Hip Exercises: Standing   Functional Squat  10 reps   with light UE support on counter     Knee/Hip Exercises: Seated   Sit to Sand  1 set;without UE support;10 reps   pt preferred this over squat at counter     Knee/Hip Exercises: Sidelying   Clams  Rt/Lt, 2 sets of 10, eccentric focus with core engaged.              PT Education - 06/16/18 0931    Education Details  HEP     Person(s) Educated  Patient    Methods  Explanation    Comprehension  Verbalized understanding  PT Short Term Goals - 06/14/18 1249      PT SHORT TERM GOAL #1   Title  Pt will be independent with initial HEP    Status  On-going      PT SHORT TERM GOAL #2   Title  Pt will report pain no greater than in the R knee 5/10 with activity including squating    Status  On-going      PT SHORT TERM GOAL #3   Title  Pt will demonstrate improved squatting mechanics to decrease risk for further injury to knee joint    Status  On-going        PT Long Term Goals - 06/16/18 0919      PT LONG TERM GOAL #1   Title  Patient will be independent with advanced HEP    Status  On-going      PT LONG TERM GOAL #2   Title  Pt will report pain no greater than 3/10 in the R knee with activity to improve tolerance to squatting and ADLs including lifting her daughter    Status  On-going      PT LONG TERM GOAL #3   Title  Pt will demonstrate normal gait pattern with no compensations due to pain to decrease risk for future injury and decrease pain in R foot    Status  On-going            Plan - 06/16/18 0947    Clinical Impression Statement  Pt tolerated all exercises well,  requiring minor cues for form.  She reported reduction of pain after completing stretches.  She preferred sit to/from stand vs functional squat at sink due to less knee pain and "feeling more successful".  Modified HEP to include stretches.  Pt progressing towards goals and will benefit from continued PT intervention to maximize functional mobility with less pain.     Rehab Potential  Good    PT Frequency  2x / week    PT Duration  6 weeks    PT Treatment/Interventions  ADLs/Self Care Home Management;Cryotherapy;Electrical Stimulation;Iontophoresis 4mg /ml Dexamethasone;Moist Heat;Gait training;Stair training;Functional mobility training;Therapeutic activities;Therapeutic exercise;Ultrasound;Balance training;Neuromuscular re-education;Patient/family education;Orthotic Fit/Training;Manual techniques;Dry needling;Taping;Vasopneumatic Device    PT Next Visit Plan  Ask Abuse/Literacy/Language Questions.  review stretches from new HEP.     Consulted and Agree with Plan of Care  Patient       Patient will benefit from skilled therapeutic intervention in order to improve the following deficits and impairments:  Abnormal gait, Decreased activity tolerance, Decreased strength, Pain, Increased muscle spasms, Improper body mechanics, Impaired flexibility, Postural dysfunction, Decreased mobility, Difficulty walking  Visit Diagnosis: Chronic pain of right knee  Other abnormalities of gait and mobility  Difficulty in walking, not elsewhere classified  Muscle weakness (generalized)     Problem List Patient Active Problem List   Diagnosis Date Noted  . Insomnia 06/30/2016  . Hypersomnia 06/30/2016  . Asthma 06/30/2016  . Vaginal delivery 01/03/2016  . Moderate persistent asthma with acute exacerbation 11/17/2014  . Pain in soft tissues of limb 02/22/2014  . Problems influencing health status 01/04/2014  . Hemorrhoids 11/24/2013  . Ganglion 07/30/2013  . Anemia 04/04/2013  . Carpal tunnel  syndrome 04/04/2013  . Surveillance for birth control, oral contraceptives 04/04/2013  . OBESITY, NOS 12/31/2006  . MIGRAINE, UNSPEC., W/O INTRACTABLE MIGRAINE 12/31/2006  . RHINITIS, ALLERGIC 12/31/2006  . GASTROESOPHAGEAL REFLUX, NO ESOPHAGITIS 12/31/2006   Mayer CamelJennifer Carlson-Long, PTA 06/16/18 12:23 PM  [facility]  [place] [address]  [address] [place], [place], [address] Phone: [phone]   Fax:  [phone]  Name: TEYANA PIERRON MRN: [id number] Date of Birth: 07-Jul-1981

## 2018-06-16 NOTE — Patient Instructions (Addendum)
SIT TO STAND:    Sit with feet shoulder-width apart, on floor. Lean chest forward, raise hips up from surface. Straighten hips and knees. Weight bear equally on left and right sides. _10__ reps per set, __1-2_ sets per day, __5_ days per week   Abduction: Clam (Eccentric) - Side-Lying    Lie on side with knees bent. Lift top knee, keeping feet together. Keep trunk steady. Slowly lower for 3-5 seconds. __10_ reps per set, __1_ sets per day, _3-4__ days per week.  HIP: Hamstrings - Supine  Place strap around foot. Raise leg up, keeping knee straight.  Bend opposite knee to protect back if indicated. Hold 30 seconds. 3 reps per set, 2-3 sets per day  Piriformis Stretch, Sitting PNF    Sit and cross leg at ankle.  Either lean over or pull knee towards opposite shoulder.  Hold for 30 seconds. Repeat 2x on each side.   Calf Stretch    Place hands on wall at shoulder height. Keeping back leg straight, bend front leg, feet pointing forward, heels flat on floor. Lean forward slightly until stretch is felt in calf of back leg. Hold stretch _30__ seconds, breathing slowly in and out. Repeat stretch with other leg back. Do _2__ sessions per day. Variation: Use chair or table for support.  Quads / HF, Prone KNEE: Quadriceps - Prone    Place strap around ankle. Bring ankle toward buttocks. Press hip into surface. Hold 30 seconds. Repeat 2-3 times per session. Do 2 sessions per day.

## 2018-06-21 ENCOUNTER — Ambulatory Visit: Payer: Medicaid Other

## 2018-06-21 DIAGNOSIS — M25561 Pain in right knee: Secondary | ICD-10-CM | POA: Diagnosis not present

## 2018-06-21 DIAGNOSIS — R262 Difficulty in walking, not elsewhere classified: Secondary | ICD-10-CM

## 2018-06-21 DIAGNOSIS — G8929 Other chronic pain: Secondary | ICD-10-CM

## 2018-06-21 DIAGNOSIS — R2689 Other abnormalities of gait and mobility: Secondary | ICD-10-CM

## 2018-06-21 DIAGNOSIS — M25571 Pain in right ankle and joints of right foot: Secondary | ICD-10-CM

## 2018-06-21 DIAGNOSIS — M25671 Stiffness of right ankle, not elsewhere classified: Secondary | ICD-10-CM

## 2018-06-21 DIAGNOSIS — M6281 Muscle weakness (generalized): Secondary | ICD-10-CM

## 2018-06-21 DIAGNOSIS — M545 Low back pain: Secondary | ICD-10-CM

## 2018-06-21 NOTE — Therapy (Signed)
[facility] [address]  [place], [place], [address] Phone: [phone]   Fax:  [phone]  Physical Therapy Treatment  Patient Details  Name: Amber Wall MRN: [id number] Date of Birth: [birth year] Referring Provider: Frederik Pear, MD (low back & knee) & Landis Martins, DPM (foot)   Encounter Date: 06/21/2018  PT End of Session - 06/21/18 1036    Visit Number  4    Number of Visits  16    Date for PT Re-Evaluation  08/06/18    Authorization Type  Medicaid    Authorization Time Period  06/09/2018-06/22/2018; new authorization request to be submitted today for additional 12 visits    Authorization - Visit Number  3    Authorization - Number of Visits  3    PT Start Time  4193    PT Stop Time  1129    PT Time Calculation (min)  60 min    Activity Tolerance  Patient tolerated treatment well    Behavior During Therapy  [facility] for tasks assessed/performed       Past Medical History:  Diagnosis Date  . Anxiety   . Bronchitis   . GERD (gastroesophageal reflux disease)   . Lactose intolerance   . Migraine   . Sinus headache     Past Surgical History:  Procedure Laterality Date  . TONSILLECTOMY    . WISDOM TOOTH EXTRACTION      There were no vitals filed for this visit.  Subjective Assessment - 06/21/18 1032    Subjective  Pt. noting some increased R knee pain which she attributes to working ~ 11 hour shift yesterday mostly standing during shift.      How long can you sit comfortably?  3 hours     How long can you stand comfortably?  8 hours     How long can you walk comfortably?  2 hours    Patient Stated Goals  wants the pain to go out of the R knee; be able to go out dancing without the R knee hurting; be able to walk long enough to go shopping w/o limitation d/t R foot pain    Currently in Pain?  Yes    Pain Score  3    10/10 at worst   Pain Location  Knee    Pain Orientation  Right    Pain  Descriptors / Indicators  Aching    Pain Type  Chronic pain    Pain Onset  More than a month ago    Pain Frequency  Intermittent    Aggravating Factors   prolonged walking or standing    Pain Relieving Factors  uncertain    Effect of Pain on Daily Activities  limited standing or walking tolerance, esp at work    Multiple Pain Sites  --    Pain Score  0   10/10 at worst   Pain Location  Back   & sacrum   Pain Orientation  Lower;Right    Pain Type  Chronic pain    Pain Onset  More than a month ago    Pain Frequency  Occasional    Aggravating Factors   prolonged sitting, standing and walking    Pain Relieving Factors  stretches, heating pad, changing position    Effect of Pain on Daily Activities  notes occasional soreness and sense of back "locking up" upon waking in the morning    Pain Score  5  8/10 at worst   Pain Location  Foot    Pain Orientation  Right;Lower;Lateral    Pain Type  Chronic pain    Pain Radiating Towards  R lateral ankle    Pain Onset  More than a month ago   10-12 months   Pain Frequency  Intermittent    Aggravating Factors   driving - pressing pedals; prolonged walking    Pain Relieving Factors  ice rolling with frozen water bottle    Effect of Pain on Daily Activities  limits walking tolerance         [facility] PT Assessment - 06/21/18 1040      Assessment   Medical Diagnosis  Low back pain, R knee pain & R peroneal tendinitis    Referring Provider  Frederik Pear, MD (low back & knee) & Landis Martins, DPM (foot)    Next MD Visit  07/20/18 with Dr. Cannon Kettle; PRN with Dr. 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, moving around bending a lot    Leisure  Keeping up with young daughter      AROM   AROM Assessment Site  Ankle    Right/Left Ankle  Right;Left    Right Ankle Dorsiflexion  8    Right Ankle Plantar Flexion  50     Right Ankle Inversion  28    Right Ankle Eversion  12    Left Ankle Dorsiflexion  15    Left Ankle Plantar Flexion  60    Left Ankle Inversion  36    Left Ankle Eversion  31    Lumbar Flexion  WNL    Lumbar Extension  WNL    Lumbar - Right Side Bend  WNL   mild thoracic back pain    Lumbar - Left Side Bend  WNL    Lumbar - Right Rotation  WFL   mild thoracic back pain    Lumbar - Left Rotation  WFL   mild thoracic back pain      Strength   Strength Assessment Site  Knee;Hip;Ankle    Right/Left Hip  Right;Left    Right Hip Flexion  4+/5    Right Hip Extension  4+/5    Right Hip External Rotation   4/5    Right Hip Internal Rotation  4+/5    Right Hip ABduction  4+/5    Right Hip ADduction  4+/5    Left Hip Flexion  4+/5    Left Hip Extension  4+/5    Left Hip External Rotation  4/5    Left Hip Internal Rotation  4+/5    Left Hip ABduction  4+/5    Left Hip ADduction  4+/5    Right/Left Knee  Right;Left    Right Knee Flexion  4+/5    Right Knee Extension  5/5    Left Knee Flexion  5/5    Left Knee Extension  5/5    Right/Left Ankle  Right;Left    Right Ankle Dorsiflexion  4/5   pain on max effort   Right Ankle Plantar Flexion  4/5   Increased pain throughout    Right Ankle Inversion  4+/5    Right Ankle Eversion  4+/5    Left Ankle Dorsiflexion  4/5    Left Ankle Plantar Flexion  5/5  Left Ankle Inversion  4+/5    Left Ankle Eversion  4+/5      Palpation   Palpation comment  TTP over lateral plantar aspect of R foot, posterior R malleolus & R peroneals (brevis > longus)                   [facility] Adult PT Treatment/Exercise - 06/21/18 1049      Ambulation/Gait   Ambulation/Gait Assistance  6: Modified independent (Device/Increase time)    Ambulation Distance (Feet)  90 Feet    Assistive device  None    Gait Pattern  Decreased trunk rotation;Decreased weight shift to right;Decreased arm swing - right;Decreased arm swing - left    Gait Comments   Pt. with slight decreased wt. shift to right however feels that she is "limping" less frequently since starting therapy      Knee/Hip Exercises: Aerobic   Nustep  Lvl 4, 7 min       Knee/Hip Exercises: Standing   Heel Raises  10 reps;Both    Functional Squat  --   x 12 reps    Functional Squat Limitations  at TM - cues for upright trunk positoning       Knee/Hip Exercises: Seated   Sit to Sand  10 reps;without UE support   from mat table      Knee/Hip Exercises: Supine   Straight Leg Raises  Right;Left;15 reps      Knee/Hip Exercises: Sidelying   Hip ABduction  Right;10 reps    Hip ABduction Limitations  Cues required for positioning       Manual Therapy   Soft tissue mobilization  pt. ttp at R lateral ankle posterior/inferior to lateral malleolus and along length of peroneal brevis tendon               PT Short Term Goals - 06/21/18 1037      PT SHORT TERM GOAL #1   Title  Pt will be independent with initial HEP    Status  Achieved      PT SHORT TERM GOAL #2   Title  Pt will report pain no greater than in the R knee 5/10 with activity including squating    Status  On-going   Pt. reports 10/10 R knee pain at worst with stairs and squatting    Target Date  07/16/18      PT SHORT TERM GOAL #3   Title  Pt will demonstrate improved squatting mechanics to decrease risk for further injury to knee joint    Status  On-going   Improved squatting mechanics with improved posterior wt. shift, however still with excessive forward trunk lean   Target Date  07/16/18      PT SHORT TERM GOAL #4   Title  Pt will demostrate static standing posture with neutral R knee and ankle positioning avoiding genu recurvatum and equinovarus to reduce strain on knee and ankle    Status  New    Target Date  07/16/18        PT Long Term Goals - 06/21/18 1038      PT LONG TERM GOAL #1   Title  Patient will be independent with advanced HEP    Status  Partially Met    Target Date   08/06/18      PT LONG TERM GOAL #2   Title  Pt will report pain no greater than 3/10 in the R knee with activity to improve tolerance to squatting and  ADLs including lifting her daughter    Status  On-going    Target Date  08/06/18      PT LONG TERM GOAL #3   Title  Pt will demonstrate normal gait pattern with no compensations due to R knee pain to decrease risk for future injury and decrease pain in R foot    Status  On-going    Target Date  08/06/18      PT LONG TERM GOAL #4   Title  R ankle ROM & strength equivalent to L w/o increased pain to allow for normal gait mechanics    Status  New    Target Date  08/06/18      PT LONG TERM GOAL #5   Title  Pt will report ability to walk through store while shopping w/o increased R foot/ ankle pain >4/10    Status  New    Target Date  08/06/18            Plan - 06/21/18 1050    Clinical Impression Statement  Dawn noting 25% improvement in knee and back pain levels since starting therapy.  Able to demo slight improvement in LE strength with MMT today and feels she is now "limping less often" at work, which she attributes to HEP activities.  Pt. demonstrating improved posterior wt. shift today with functional squat however does require cueing to avoid excessive anterior trunk lean as to reduce lumbar and knee strain.  Reports R lateral ankle/foot pain with long periods of walking/standing while shopping and at work, which rises to 8/10 at worst.  Orders received from podiatrist, Dr. Landis Martins, to incorporate R foot/ankle (peroneal tendonitis) into POC to help reduce pain and inflammation as well as for strengthening and gait training to improve her tolerance for prolonged walking and standing at work and with shopping.  Pt. demonstrates decreased R ankle ROM and strength relative to L.  Pt. ttp along lateral aspect of plantar surface of her R foot as well as R lateral ankle/peroneus brevis and reported R ankle discomfort with standing heel  raise today.  Pt. will continue to benefit from further skilled therapy for R knee and low back to improve tolerance for work and functional activities.  Additional goals & interventions added to POC to address R ankle deficits to focus on improving R ankle ROM and strength to improve tolerance for work related tasks.        Rehab Potential  Good    PT Frequency  2x / week    PT Duration  6 weeks    PT Treatment/Interventions  ADLs/Self Care Home Management;Cryotherapy;Electrical Stimulation;Iontophoresis '4mg'$ /ml Dexamethasone;Moist Heat;Gait training;Stair training;Functional mobility training;Therapeutic activities;Therapeutic exercise;Ultrasound;Balance training;Neuromuscular re-education;Patient/family education;Orthotic Fit/Training;Manual techniques;Dry needling;Taping;Vasopneumatic Device    PT Next Visit Plan  Continue to check tolerance for updated HEP    Consulted and Agree with Plan of Care  Patient       Patient will benefit from skilled therapeutic intervention in order to improve the following deficits and impairments:  Abnormal gait, Decreased activity tolerance, Decreased strength, Pain, Increased muscle spasms, Improper body mechanics, Impaired flexibility, Postural dysfunction, Decreased mobility, Difficulty walking, Decreased balance, Decreased safety awareness, Decreased range of motion  Visit Diagnosis: Chronic pain of right knee  Other abnormalities of gait and mobility  Difficulty in walking, not elsewhere classified  Muscle weakness (generalized)  Pain in right ankle and joints of right foot  Stiffness of right ankle, not elsewhere classified  Chronic midline low back pain without sciatica  Problem List Patient Active Problem List   Diagnosis Date Noted  . Insomnia 06/30/2016  . Hypersomnia 06/30/2016  . Asthma 06/30/2016  . Vaginal delivery 01/03/2016  . Moderate persistent asthma with acute exacerbation 11/17/2014  . Pain in soft tissues of limb  02/22/2014  . Problems influencing health status 01/04/2014  . Hemorrhoids 11/24/2013  . Ganglion 07/30/2013  . Anemia 04/04/2013  . Carpal tunnel syndrome 04/04/2013  . Surveillance for birth control, oral contraceptives 04/04/2013  . OBESITY, NOS 12/31/2006  . MIGRAINE, UNSPEC., W/O INTRACTABLE MIGRAINE 12/31/2006  . RHINITIS, ALLERGIC 12/31/2006  . GASTROESOPHAGEAL REFLUX, NO ESOPHAGITIS 12/31/2006    Bess Harvest, PTA 06/21/18 7:19 PM   Percival Spanish, PT, MPT 06/21/18, 7:19 PM    [facility] [address]  [place], [place], [address] Phone: [phone]   Fax:  [phone]  Name: PENNI PENADO MRN: [id number] Date of Birth: 05/06/1981

## 2018-06-24 ENCOUNTER — Ambulatory Visit: Payer: Medicaid Other

## 2018-06-24 DIAGNOSIS — M25671 Stiffness of right ankle, not elsewhere classified: Secondary | ICD-10-CM

## 2018-06-24 DIAGNOSIS — M25571 Pain in right ankle and joints of right foot: Secondary | ICD-10-CM

## 2018-06-24 DIAGNOSIS — M25561 Pain in right knee: Secondary | ICD-10-CM | POA: Diagnosis not present

## 2018-06-24 DIAGNOSIS — G8929 Other chronic pain: Secondary | ICD-10-CM

## 2018-06-24 DIAGNOSIS — M545 Low back pain: Secondary | ICD-10-CM

## 2018-06-24 DIAGNOSIS — R262 Difficulty in walking, not elsewhere classified: Secondary | ICD-10-CM

## 2018-06-24 DIAGNOSIS — R2689 Other abnormalities of gait and mobility: Secondary | ICD-10-CM

## 2018-06-24 DIAGNOSIS — M6281 Muscle weakness (generalized): Secondary | ICD-10-CM

## 2018-06-24 NOTE — Therapy (Signed)
[facility] [address]  [place], [place], [address] Phone: [phone]   Fax:  [phone]  Physical Therapy Treatment  Patient Details  Name: Amber Wall MRN: [id number] Date of Birth: 1981-08-15 Referring Provider: Frederik Pear, MD (low back & knee) & Landis Martins, DPM (foot)   Encounter Date: 06/24/2018  PT End of Session - 06/24/18 0938    Visit Number  5    Number of Visits  16    Date for PT Re-Evaluation  08/06/18    Authorization Type  Medicaid    Authorization Time Period  06/24/2018-08/04/2018    Authorization - Visit Number  1    Authorization - Number of Visits  12    PT Start Time  0935    PT Stop Time  1013    PT Time Calculation (min)  38 min    Activity Tolerance  Patient tolerated treatment well    Behavior During Therapy  [facility] for tasks assessed/performed       Past Medical History:  Diagnosis Date  . Anxiety   . Bronchitis   . GERD (gastroesophageal reflux disease)   . Lactose intolerance   . Migraine   . Sinus headache     Past Surgical History:  Procedure Laterality Date  . TONSILLECTOMY    . WISDOM TOOTH EXTRACTION      There were no vitals filed for this visit.  Subjective Assessment - 06/24/18 0950    Subjective  Pt. doing well today noting some calf pain following last visit which subsided.      Patient Stated Goals  wants the pain to go out of the R knee; be able to go out dancing without the R knee hurting; be able to walk long enough to go shopping w/o limitation d/t R foot pain    Currently in Pain?  Yes    Pain Score  3    at 4/10 pain at worst    Pain Location  Knee    Pain Orientation  Right    Pain Descriptors / Indicators  Aching    Pain Type  Chronic pain    Pain Onset  More than a month ago    Pain Frequency  Intermittent    Multiple Pain Sites  No    Pain Score  0    Pain Location  Back    Pain Orientation  Right    Pain Type  Chronic pain    Pain  Onset  More than a month ago    Pain Frequency  Occasional    Aggravating Factors   Unsure     Pain Score  0   up to 3/10 with prolonged walking    Pain Location  Foot    Pain Orientation  Right    Pain Type  Chronic pain                       [facility] Adult PT Treatment/Exercise - 06/24/18 0959      Knee/Hip Exercises: Stretches   Gastroc Stretch  Right;2 reps;30 seconds;Left    Gastroc Stretch Limitations  wall     Soleus Stretch  Right;2 reps;30 seconds;Left    Soleus Stretch Limitations  wall       Knee/Hip Exercises: Aerobic   Recumbent Bike  L1 x 6 min      Knee/Hip Exercises: Standing   Hip Extension  Right;Left;10 reps;Stengthening;Knee straight   With  cueing required to avoid knee hyperextension    Extension Limitations  with yellow TB at ankles     Functional Squat  15 reps;3 seconds    Functional Squat Limitations  TRX with cueing for proper knee positioning and wt. shift       Knee/Hip Exercises: Sidelying   Clams  B clam shell with red TB at knees       Ankle Exercises: Seated   Toe Raise  20 reps;3 seconds    Toe Raise Limitations  with ball squeeze between ankles to maintain neutral ankle positioning     Other Seated Ankle Exercises  Seated R "Arch Creattion" 3" x 15 reps    Required cueing for proper motion      Ankle Exercises: Standing   Toe Raise  15 reps;3 seconds    Toe Raise Limitations  at counter with cues to avoid knee hyperextension                PT Short Term Goals - 06/24/18 0939      PT SHORT TERM GOAL #1   Title  Pt will be independent with initial HEP    Status  Achieved      PT SHORT TERM GOAL #2   Title  Pt will report pain no greater than in the R knee 5/10 with activity including squating    Status  On-going   Pt. reports 10/10 R knee pain at worst with stairs and squatting      PT SHORT TERM GOAL #3   Title  Pt will demonstrate improved squatting mechanics to decrease risk for further injury to knee joint     Status  On-going   Improved squatting mechanics with improved posterior wt. shift, however still with excessive forward trunk lean     PT SHORT TERM GOAL #4   Title  Pt will demostrate static standing posture with neutral R knee and ankle positioning avoiding genu recurvatum and equinovarus to reduce strain on knee and ankle    Status  On-going        PT Long Term Goals - 06/24/18 1001      PT LONG TERM GOAL #1   Title  Patient will be independent with advanced HEP    Status  Partially Met      PT LONG TERM GOAL #2   Title  Pt will report pain no greater than 3/10 in the R knee with activity to improve tolerance to squatting and ADLs including lifting her daughter    Status  On-going      PT LONG TERM GOAL #3   Title  Pt will demonstrate normal gait pattern with no compensations due to R knee pain to decrease risk for future injury and decrease pain in R foot    Status  On-going      PT LONG TERM GOAL #4   Title  R ankle ROM & strength equivalent to L w/o increased pain to allow for normal gait mechanics    Status  On-going      PT LONG TERM GOAL #5   Title  Pt will report ability to walk through store while shopping w/o increased R foot/ ankle pain >4/10    Status  On-going            Plan - 06/24/18 0947    Clinical Impression Statement  [name] doing well today noting she felt fine after last visit.  Progressed LE/ankle strengthening activities without issue today however  pt. requiring frequent vc/tc for neutral knee and ankle positioning.  Ended visit pain free.  Will continue to progress toward goals and update HEP pending response to today's visit in coming visits.    PT Treatment/Interventions  ADLs/Self Care Home Management;Cryotherapy;Electrical Stimulation;Iontophoresis 39m/ml Dexamethasone;Moist Heat;Gait training;Stair training;Functional mobility training;Therapeutic activities;Therapeutic exercise;Ultrasound;Balance training;Neuromuscular  re-education;Patient/family education;Orthotic Fit/Training;Manual techniques;Dry needling;Taping;Vasopneumatic Device    Consulted and Agree with Plan of Care  Patient       Patient will benefit from skilled therapeutic intervention in order to improve the following deficits and impairments:  Abnormal gait, Decreased activity tolerance, Decreased strength, Pain, Increased muscle spasms, Improper body mechanics, Impaired flexibility, Postural dysfunction, Decreased mobility, Difficulty walking, Decreased balance, Decreased safety awareness, Decreased range of motion  Visit Diagnosis: Chronic pain of right knee  Other abnormalities of gait and mobility  Difficulty in walking, not elsewhere classified  Muscle weakness (generalized)  Pain in right ankle and joints of right foot  Stiffness of right ankle, not elsewhere classified  Chronic midline low back pain without sciatica     Problem List Patient Active Problem List   Diagnosis Date Noted  . Insomnia 06/30/2016  . Hypersomnia 06/30/2016  . Asthma 06/30/2016  . Vaginal delivery 01/03/2016  . Moderate persistent asthma with acute exacerbation 11/17/2014  . Pain in soft tissues of limb 02/22/2014  . Problems influencing health status 01/04/2014  . Hemorrhoids 11/24/2013  . Ganglion 07/30/2013  . Anemia 04/04/2013  . Carpal tunnel syndrome 04/04/2013  . Surveillance for birth control, oral contraceptives 04/04/2013  . OBESITY, NOS 12/31/2006  . MIGRAINE, UNSPEC., W/O INTRACTABLE MIGRAINE 12/31/2006  . RHINITIS, ALLERGIC 12/31/2006  . GASTROESOPHAGEAL REFLUX, NO ESOPHAGITIS 12/31/2006    MBess Harvest PTA 06/24/18 12:03 PM   [facility] [address] [place] [id number]: [phone]  Fax:  [phone] Name: DDEL WISEMANMRN: [id number] of Birth: [birth year]

## 2018-06-28 ENCOUNTER — Ambulatory Visit: Payer: Medicaid Other

## 2018-06-28 DIAGNOSIS — G8929 Other chronic pain: Secondary | ICD-10-CM

## 2018-06-28 DIAGNOSIS — R262 Difficulty in walking, not elsewhere classified: Secondary | ICD-10-CM

## 2018-06-28 DIAGNOSIS — M25571 Pain in right ankle and joints of right foot: Secondary | ICD-10-CM

## 2018-06-28 DIAGNOSIS — M6281 Muscle weakness (generalized): Secondary | ICD-10-CM

## 2018-06-28 DIAGNOSIS — M25561 Pain in right knee: Secondary | ICD-10-CM | POA: Diagnosis not present

## 2018-06-28 DIAGNOSIS — R2689 Other abnormalities of gait and mobility: Secondary | ICD-10-CM

## 2018-06-28 DIAGNOSIS — M545 Low back pain, unspecified: Secondary | ICD-10-CM

## 2018-06-28 DIAGNOSIS — M25671 Stiffness of right ankle, not elsewhere classified: Secondary | ICD-10-CM

## 2018-06-28 NOTE — Therapy (Signed)
[facility] [address]  [place], [place], [address] Phone: [phone]   Fax:  [phone]  Physical Therapy Treatment  Patient Details  Name: Amber Wall MRN: [id number] Date of Birth: 1981/01/01 Referring Provider: Frederik Pear, MD (low back & knee) & Landis Martins, DPM (foot)   Encounter Date: 06/28/2018  PT End of Session - 06/28/18 1326    Visit Number  6    Number of Visits  16    Date for PT Re-Evaluation  08/06/18    Authorization Type  Medicaid    Authorization Time Period  06/24/2018-08/04/2018    Authorization - Visit Number  2    Authorization - Number of Visits  12    PT Start Time  8022    PT Stop Time  1400    PT Time Calculation (min)  38 min    Activity Tolerance  Patient tolerated treatment well    Behavior During Therapy  [facility] for tasks assessed/performed       Past Medical History:  Diagnosis Date  . Anxiety   . Bronchitis   . GERD (gastroesophageal reflux disease)   . Lactose intolerance   . Migraine   . Sinus headache     Past Surgical History:  Procedure Laterality Date  . TONSILLECTOMY    . WISDOM TOOTH EXTRACTION      There were no vitals filed for this visit.  Subjective Assessment - 06/28/18 1325    Subjective  Pt. reporting she has been free of knee pain over this past few days.      Patient Stated Goals  wants the pain to go out of the R knee; be able to go out dancing without the R knee hurting; be able to walk long enough to go shopping w/o limitation d/t R foot pain    Currently in Pain?  No/denies    Pain Score  0-No pain    Multiple Pain Sites  No                       [facility] Adult PT Treatment/Exercise - 06/28/18 1331      Lumbar Exercises: Supine   Isometric Hip Flexion  10 reps;3 seconds    Isometric Hip Flexion Limitations  single LE      Knee/Hip Exercises: Aerobic   Nustep  Lvl 5, 6 min       Knee/Hip Exercises: Standing   Heel  Raises  15 reps;Both    Heel Raises Limitations  Chair - ball squeeze btw medial ankles     Forward Step Up  Right;10 reps;Step Height: 6";Hand Hold: 1    Other Standing Knee Exercises  B calf raise standing on airex pad x 15 rpes with ball squeeze between medial ankles       Knee/Hip Exercises: Seated   Hamstring Curl  Right;15 reps    Hamstring Limitations  red TB at ankle    Sit to Sand  15 reps;without UE support   with red TB at knees      Knee/Hip Exercises: Supine   Other Supine Knee/Hip Exercises  Hooklying alternating hip abd/ER with red TB at kneees x 12 reps       Knee/Hip Exercises: Sidelying   Clams  B clam shell with red TB at knees x 12 reps              PT Education - 06/28/18 1432  Education Details  HEP update     Person(s) Educated  Patient    Methods  Explanation;Verbal cues;Handout;Demonstration    Comprehension  Verbalized understanding;Returned demonstration;Verbal cues required;Need further instruction       PT Short Term Goals - 06/24/18 0939      PT SHORT TERM GOAL #1   Title  Pt will be independent with initial HEP    Status  Achieved      PT SHORT TERM GOAL #2   Title  Pt will report pain no greater than in the R knee 5/10 with activity including squating    Status  On-going   Pt. reports 10/10 R knee pain at worst with stairs and squatting      PT SHORT TERM GOAL #3   Title  Pt will demonstrate improved squatting mechanics to decrease risk for further injury to knee joint    Status  On-going   Improved squatting mechanics with improved posterior wt. shift, however still with excessive forward trunk lean     PT SHORT TERM GOAL #4   Title  Pt will demostrate static standing posture with neutral R knee and ankle positioning avoiding genu recurvatum and equinovarus to reduce strain on knee and ankle    Status  On-going        PT Long Term Goals - 06/24/18 1001      PT LONG TERM GOAL #1   Title  Patient will be independent with  advanced HEP    Status  Partially Met      PT LONG TERM GOAL #2   Title  Pt will report pain no greater than 3/10 in the R knee with activity to improve tolerance to squatting and ADLs including lifting her daughter    Status  On-going      PT LONG TERM GOAL #3   Title  Pt will demonstrate normal gait pattern with no compensations due to R knee pain to decrease risk for future injury and decrease pain in R foot    Status  On-going      PT LONG TERM GOAL #4   Title  R ankle ROM & strength equivalent to L w/o increased pain to allow for normal gait mechanics    Status  On-going      PT LONG TERM GOAL #5   Title  Pt will report ability to walk through store while shopping w/o increased R foot/ ankle pain >4/10    Status  On-going            Plan - 06/28/18 1326    Clinical Impression Statement  [name] noting she has not had R knee pain over last two days however has not been able to consistently perform HEP due to being busy over weekend.  Notes no recent LBP and has felt improvement in ankle pain with navigating stairs being primary complaint today.  Tolerated progression of LE strengthening activities well today focused on maintaining neutral ankle knee positioning with stepping and squatting activities.  Pt. with improved awareness of knee/ankle positioning with therex today requiring less cueing to correct.  Will continue to progress toward goals.      PT Treatment/Interventions  ADLs/Self Care Home Management;Cryotherapy;Electrical Stimulation;Iontophoresis 70m/ml Dexamethasone;Moist Heat;Gait training;Stair training;Functional mobility training;Therapeutic activities;Therapeutic exercise;Ultrasound;Balance training;Neuromuscular re-education;Patient/family education;Orthotic Fit/Training;Manual techniques;Dry needling;Taping;Vasopneumatic Device    Consulted and Agree with Plan of Care  Patient       Patient will benefit from skilled therapeutic intervention in order to improve the  following  deficits and impairments:  Abnormal gait, Decreased activity tolerance, Decreased strength, Pain, Increased muscle spasms, Improper body mechanics, Impaired flexibility, Postural dysfunction, Decreased mobility, Difficulty walking, Decreased balance, Decreased safety awareness, Decreased range of motion  Visit Diagnosis: Chronic pain of right knee  Other abnormalities of gait and mobility  Difficulty in walking, not elsewhere classified  Muscle weakness (generalized)  Pain in right ankle and joints of right foot  Stiffness of right ankle, not elsewhere classified  Chronic midline low back pain without sciatica     Problem List Patient Active Problem List   Diagnosis Date Noted  . Insomnia 06/30/2016  . Hypersomnia 06/30/2016  . Asthma 06/30/2016  . Vaginal delivery 01/03/2016  . Moderate persistent asthma with acute exacerbation 11/17/2014  . Pain in soft tissues of limb 02/22/2014  . Problems influencing health status 01/04/2014  . Hemorrhoids 11/24/2013  . Ganglion 07/30/2013  . Anemia 04/04/2013  . Carpal tunnel syndrome 04/04/2013  . Surveillance for birth control, oral contraceptives 04/04/2013  . OBESITY, NOS 12/31/2006  . MIGRAINE, UNSPEC., W/O INTRACTABLE MIGRAINE 12/31/2006  . RHINITIS, ALLERGIC 12/31/2006  . GASTROESOPHAGEAL REFLUX, NO ESOPHAGITIS 12/31/2006    Bess Harvest, PTA 06/28/18 2:38 PM   [facility] [address]  [place], [place], [address] Phone: [phone]   Fax:  [phone]  Name: Amber Wall MRN: [id number] Date of Birth: [birth year]

## 2018-06-30 ENCOUNTER — Ambulatory Visit: Payer: Medicaid Other

## 2018-07-01 ENCOUNTER — Ambulatory Visit: Payer: Medicaid Other

## 2018-07-07 ENCOUNTER — Ambulatory Visit: Payer: Medicaid Other | Attending: Orthopedic Surgery | Admitting: Physical Therapy

## 2018-07-07 ENCOUNTER — Encounter: Payer: Self-pay | Admitting: Physical Therapy

## 2018-07-07 DIAGNOSIS — M25571 Pain in right ankle and joints of right foot: Secondary | ICD-10-CM | POA: Diagnosis present

## 2018-07-07 DIAGNOSIS — G8929 Other chronic pain: Secondary | ICD-10-CM

## 2018-07-07 DIAGNOSIS — M545 Low back pain, unspecified: Secondary | ICD-10-CM

## 2018-07-07 DIAGNOSIS — M25671 Stiffness of right ankle, not elsewhere classified: Secondary | ICD-10-CM | POA: Diagnosis present

## 2018-07-07 DIAGNOSIS — R262 Difficulty in walking, not elsewhere classified: Secondary | ICD-10-CM | POA: Diagnosis present

## 2018-07-07 DIAGNOSIS — R2689 Other abnormalities of gait and mobility: Secondary | ICD-10-CM | POA: Diagnosis present

## 2018-07-07 DIAGNOSIS — M25561 Pain in right knee: Secondary | ICD-10-CM | POA: Diagnosis present

## 2018-07-07 DIAGNOSIS — M6281 Muscle weakness (generalized): Secondary | ICD-10-CM

## 2018-07-07 NOTE — Therapy (Signed)
[facility] [address]  [place], [place], [address] Phone: [phone]   Fax:  [phone]  Physical Therapy Treatment  Patient Details  Name: Amber Wall MRN: [id number] Date of Birth: 1981-03-17 Referring Provider: Frederik Pear, MD (low back & knee) & Landis Martins, DPM (foot)   Encounter Date: 07/07/2018  PT End of Session - 07/07/18 1023    Visit Number  7    Number of Visits  16    Date for PT Re-Evaluation  08/06/18    Authorization Type  Medicaid    Authorization Time Period  06/24/2018-08/04/2018    Authorization - Visit Number  3    Authorization - Number of Visits  12    PT Start Time  2229    PT Stop Time  1106    PT Time Calculation (min)  43 min    Activity Tolerance  Patient tolerated treatment well    Behavior During Therapy  [facility] for tasks assessed/performed       Past Medical History:  Diagnosis Date  . Anxiety   . Bronchitis   . GERD (gastroesophageal reflux disease)   . Lactose intolerance   . Migraine   . Sinus headache     Past Surgical History:  Procedure Laterality Date  . TONSILLECTOMY    . WISDOM TOOTH EXTRACTION      There were no vitals filed for this visit.  Subjective Assessment - 07/07/18 1025    Subjective  Pt reporting pain has been better overall, except R foot at 6-7/10 last night - better now after icing and working through exercises. Was able to go dancing last night w/o knee pain but this may have been the trigger for the ankle pain as it started ~2 hrs later.    Patient Stated Goals  wants the pain to go out of the R knee; be able to go out dancing without the R knee hurting; be able to walk long enough to go shopping w/o limitation d/t R foot pain    Currently in Pain?  No/denies                       [facility] Adult PT Treatment/Exercise - 07/07/18 1023      Exercises   Exercises  Knee/Hip;Ankle      Knee/Hip Exercises: Aerobic   Nustep  L5 x  6 min      Knee/Hip Exercises: Standing   Heel Raises  15 reps;Both    Heel Raises Limitations  attempted with ball btw ankles to encourage pronation and limit supination, but not successful and causing increased R foot pain, therefore ball removed    Functional Squat  15 reps;3 seconds    Functional Squat Limitations  counter squat - continued cueing for posterior weight shift avoiding knees in front of toes while maintaining upright trunk position      Manual Therapy   Manual Therapy  Taping    Kinesiotex  Facilitate Muscle      Kinesiotix   Facilitate Muscle   R ankle - saddle to promote neutral hindfoot alignment + 30-50% posterior tibialis      Ankle Exercises: Seated   Other Seated Ankle Exercises  R 4 way ankle with yellow TB x 10             PT Education - 07/07/18 1106    Education Details  HEP update - ankle strengthening    Person(s)  Educated  Patient    Methods  Explanation;Demonstration;Handout    Comprehension  Verbalized understanding;Returned demonstration;Need further instruction       PT Short Term Goals - 07/07/18 1030      PT SHORT TERM GOAL #1   Title  Pt will be independent with initial HEP    Status  Achieved      PT SHORT TERM GOAL #2   Title  Pt will report pain no greater than in the R knee 5/10 with activity including squating    Status  Achieved      PT SHORT TERM GOAL #3   Title  Pt will demonstrate improved squatting mechanics to decrease risk for further injury to knee joint    Status  Partially Met      PT SHORT TERM GOAL #4   Title  Pt will demostrate static standing posture with neutral R knee and ankle positioning avoiding genu recurvatum and equinovarus to reduce strain on knee and ankle    Status  Partially Met        PT Long Term Goals - 06/24/18 1001      PT LONG TERM GOAL #1   Title  Patient will be independent with advanced HEP    Status  Partially Met      PT LONG TERM GOAL #2   Title  Pt will report pain no  greater than 3/10 in the R knee with activity to improve tolerance to squatting and ADLs including lifting her daughter    Status  On-going      PT LONG TERM GOAL #3   Title  Pt will demonstrate normal gait pattern with no compensations due to R knee pain to decrease risk for future injury and decrease pain in R foot    Status  On-going      PT LONG TERM GOAL #4   Title  R ankle ROM & strength equivalent to L w/o increased pain to allow for normal gait mechanics    Status  On-going      PT LONG TERM GOAL #5   Title  Pt will report ability to walk through store while shopping w/o increased R foot/ ankle pain >4/10    Status  On-going            Plan - 07/07/18 1100    Clinical Impression Statement  Maurisa reporting improvement in back and knee pain since start of PT and was able to dance last night w/o increase in pain, although did note increased R ankle pain starting ~2 hrs afterward. She continues to demonstrate tendency to hyperextend at knees and supinate feet in stance with close monitoring necessary during exercises to maintain desired neutral alignment. Introduced 4 way ankle strengthening with emphasis on awareness of neutral ankle alignment during DF & PF as well as controlled eccentric release with all motions.    Rehab Potential  Good    PT Treatment/Interventions  ADLs/Self Care Home Management;Cryotherapy;Electrical Stimulation;Iontophoresis 71m/ml Dexamethasone;Moist Heat;Gait training;Stair training;Functional mobility training;Therapeutic activities;Therapeutic exercise;Ultrasound;Balance training;Neuromuscular re-education;Patient/family education;Orthotic Fit/Training;Manual techniques;Dry needling;Taping;Vasopneumatic Device    Consulted and Agree with Plan of Care  Patient       Patient will benefit from skilled therapeutic intervention in order to improve the following deficits and impairments:  Abnormal gait, Decreased activity tolerance, Decreased strength, Pain,  Increased muscle spasms, Improper body mechanics, Impaired flexibility, Postural dysfunction, Decreased mobility, Difficulty walking, Decreased balance, Decreased safety awareness, Decreased range of motion  Visit Diagnosis: Chronic pain of right  knee  Other abnormalities of gait and mobility  Difficulty in walking, not elsewhere classified  Muscle weakness (generalized)  Pain in right ankle and joints of right foot  Stiffness of right ankle, not elsewhere classified  Chronic midline low back pain without sciatica     Problem List Patient Active Problem List   Diagnosis Date Noted  . Insomnia 06/30/2016  . Hypersomnia 06/30/2016  . Asthma 06/30/2016  . Vaginal delivery 01/03/2016  . Moderate persistent asthma with acute exacerbation 11/17/2014  . Pain in soft tissues of limb 02/22/2014  . Problems influencing health status 01/04/2014  . Hemorrhoids 11/24/2013  . Ganglion 07/30/2013  . Anemia 04/04/2013  . Carpal tunnel syndrome 04/04/2013  . Surveillance for birth control, oral contraceptives 04/04/2013  . OBESITY, NOS 12/31/2006  . MIGRAINE, UNSPEC., W/O INTRACTABLE MIGRAINE 12/31/2006  . RHINITIS, ALLERGIC 12/31/2006  . GASTROESOPHAGEAL REFLUX, NO ESOPHAGITIS 12/31/2006    Percival Spanish, PT, MPT 07/07/2018, 2:25 PM  [facility] [address]  [place], [place], [address] Phone: [phone]   Fax:  [phone]  Name: CYRENE GHARIBIAN MRN: [id number] Date of Birth: 08-13-1981

## 2018-07-12 ENCOUNTER — Encounter: Payer: Self-pay | Admitting: Physical Therapy

## 2018-07-12 ENCOUNTER — Ambulatory Visit: Payer: Medicaid Other | Admitting: Physical Therapy

## 2018-07-12 DIAGNOSIS — M545 Low back pain, unspecified: Secondary | ICD-10-CM

## 2018-07-12 DIAGNOSIS — G8929 Other chronic pain: Secondary | ICD-10-CM

## 2018-07-12 DIAGNOSIS — M25571 Pain in right ankle and joints of right foot: Secondary | ICD-10-CM

## 2018-07-12 DIAGNOSIS — R2689 Other abnormalities of gait and mobility: Secondary | ICD-10-CM

## 2018-07-12 DIAGNOSIS — M25561 Pain in right knee: Principal | ICD-10-CM

## 2018-07-12 DIAGNOSIS — M25671 Stiffness of right ankle, not elsewhere classified: Secondary | ICD-10-CM

## 2018-07-12 DIAGNOSIS — R262 Difficulty in walking, not elsewhere classified: Secondary | ICD-10-CM

## 2018-07-12 DIAGNOSIS — M6281 Muscle weakness (generalized): Secondary | ICD-10-CM

## 2018-07-12 NOTE — Therapy (Signed)
[facility] [address]  [place], [place], [address] Phone: [phone]   Fax:  [phone]  Physical Therapy Treatment  Patient Details  Name: LOANY NEUROTH MRN: [id number] Date of Birth: 11/17/1980 Referring Provider: Frederik Pear, MD (low back & knee) & Landis Martins, DPM (foot)   Encounter Date: 07/12/2018  PT End of Session - 07/12/18 0935    Visit Number  8    Number of Visits  16    Date for PT Re-Evaluation  08/06/18    Authorization Type  Medicaid    Authorization Time Period  06/24/2018-08/04/2018    Authorization - Visit Number  4    Authorization - Number of Visits  12    PT Start Time  0935    PT Stop Time  1022    PT Time Calculation (min)  47 min    Activity Tolerance  Patient tolerated treatment well    Behavior During Therapy  [facility] for tasks assessed/performed       Past Medical History:  Diagnosis Date  . Anxiety   . Bronchitis   . GERD (gastroesophageal reflux disease)   . Lactose intolerance   . Migraine   . Sinus headache     Past Surgical History:  Procedure Laterality Date  . TONSILLECTOMY    . WISDOM TOOTH EXTRACTION      There were no vitals filed for this visit.  Subjective Assessment - 07/12/18 0947    Subjective  Pt having a good day -pain free.    Patient Stated Goals  wants the pain to go out of the R knee; be able to go out dancing without the R knee hurting; be able to walk long enough to go shopping w/o limitation d/t R foot pain    Currently in Pain?  No/denies                       [facility] Adult PT Treatment/Exercise - 07/12/18 0001      Self-Care   Self-Care  Posture;Other Self-Care Comments    Posture  Reinforced abbominal bracing with posterior pelvic tilt in standing to promote neutral lumbopelvic alignment and decrease genu recurvatum & foot pronation in stance.    Other Self-Care Comments   Reviewed self-STM for glutes with tennis ball or  similar small ball      Exercises   Exercises  Knee/Hip;Ankle      Knee/Hip Exercises: Aerobic   Recumbent Bike  L2 x 6 min      Knee/Hip Exercises: Supine   Bridges  Both;15 reps    Bridges Limitations  + hip ABD isometric with green TB    Bridges with Clamshell  Both;10 reps;Strengthening   alt hip ABD/ER with green TB     Knee/Hip Exercises: Sidelying   Clams  B clam with green TB x 15      Ankle Exercises: Seated   Other Seated Ankle Exercises  R PF with red TB & R DF/IV/EV ankle with yellow TB x 15 each               PT Short Term Goals - 07/07/18 1030      PT SHORT TERM GOAL #1   Title  Pt will be independent with initial HEP    Status  Achieved      PT SHORT TERM GOAL #2   Title  Pt will report pain no greater than in the R  knee 5/10 with activity including squating    Status  Achieved      PT SHORT TERM GOAL #3   Title  Pt will demonstrate improved squatting mechanics to decrease risk for further injury to knee joint    Status  Partially Met      PT SHORT TERM GOAL #4   Title  Pt will demostrate static standing posture with neutral R knee and ankle positioning avoiding genu recurvatum and equinovarus to reduce strain on knee and ankle    Status  Partially Met        PT Long Term Goals - 06/24/18 1001      PT LONG TERM GOAL #1   Title  Patient will be independent with advanced HEP    Status  Partially Met      PT LONG TERM GOAL #2   Title  Pt will report pain no greater than 3/10 in the R knee with activity to improve tolerance to squatting and ADLs including lifting her daughter    Status  On-going      PT LONG TERM GOAL #3   Title  Pt will demonstrate normal gait pattern with no compensations due to R knee pain to decrease risk for future injury and decrease pain in R foot    Status  On-going      PT LONG TERM GOAL #4   Title  R ankle ROM & strength equivalent to L w/o increased pain to allow for normal gait mechanics    Status  On-going       PT LONG TERM GOAL #5   Title  Pt will report ability to walk through store while shopping w/o increased R foot/ ankle pain >4/10    Status  On-going            Plan - 07/12/18 0951    Clinical Impression Statement  Kiala remains pain free today, noting better tolerance for standing at work with less pain interference and imporved ability to squat w/o knee pain. Ankle TB HEP reviewed with pt requiring extensive cueing to clarify positioning and technique - pt instructed to progress PF to red or green as along as able to maintain neutral alignment and good control. Also provided instruction in self-STM for glutes using ball on wall. Pt continues to demonstrate tendency for anterior pelvic tilt with increased lumbar lordosis and knee hyperextension in standing - provided cues for posterior pelvic tilt to correct lumbopelvic alignment and decrease tendency for genu recurvatum.    Rehab Potential  Good    PT Treatment/Interventions  ADLs/Self Care Home Management;Cryotherapy;Electrical Stimulation;Iontophoresis 19m/ml Dexamethasone;Moist Heat;Gait training;Stair training;Functional mobility training;Therapeutic activities;Therapeutic exercise;Ultrasound;Balance training;Neuromuscular re-education;Patient/family education;Orthotic Fit/Training;Manual techniques;Dry needling;Taping;Vasopneumatic Device    Consulted and Agree with Plan of Care  Patient       Patient will benefit from skilled therapeutic intervention in order to improve the following deficits and impairments:  Abnormal gait, Decreased activity tolerance, Decreased strength, Pain, Increased muscle spasms, Improper body mechanics, Impaired flexibility, Postural dysfunction, Decreased mobility, Difficulty walking, Decreased balance, Decreased safety awareness, Decreased range of motion  Visit Diagnosis: Chronic pain of right knee  Other abnormalities of gait and mobility  Difficulty in walking, not elsewhere classified  Muscle  weakness (generalized)  Pain in right ankle and joints of right foot  Stiffness of right ankle, not elsewhere classified  Chronic midline low back pain without sciatica     Problem List Patient Active Problem List   Diagnosis Date Noted  . Insomnia  06/30/2016  . Hypersomnia 06/30/2016  . Asthma 06/30/2016  . Vaginal delivery 01/03/2016  . Moderate persistent asthma with acute exacerbation 11/17/2014  . Pain in soft tissues of limb 02/22/2014  . Problems influencing health status 01/04/2014  . Hemorrhoids 11/24/2013  . Ganglion 07/30/2013  . Anemia 04/04/2013  . Carpal tunnel syndrome 04/04/2013  . Surveillance for birth control, oral contraceptives 04/04/2013  . OBESITY, NOS 12/31/2006  . MIGRAINE, UNSPEC., W/O INTRACTABLE MIGRAINE 12/31/2006  . RHINITIS, ALLERGIC 12/31/2006  . GASTROESOPHAGEAL REFLUX, NO ESOPHAGITIS 12/31/2006    Percival Spanish, PT, MPT  07/12/2018, 10:30 AM  [facility] [address]  [place], [place], [address] Phone: [phone]   Fax:  [phone]  Name: ANYRA KAUFMAN MRN: [id number] Date of Birth: 20-Jan-1981

## 2018-07-14 ENCOUNTER — Ambulatory Visit: Payer: Medicaid Other

## 2018-07-14 DIAGNOSIS — R262 Difficulty in walking, not elsewhere classified: Secondary | ICD-10-CM

## 2018-07-14 DIAGNOSIS — M545 Low back pain, unspecified: Secondary | ICD-10-CM

## 2018-07-14 DIAGNOSIS — M25671 Stiffness of right ankle, not elsewhere classified: Secondary | ICD-10-CM

## 2018-07-14 DIAGNOSIS — M6281 Muscle weakness (generalized): Secondary | ICD-10-CM

## 2018-07-14 DIAGNOSIS — M25561 Pain in right knee: Secondary | ICD-10-CM | POA: Diagnosis not present

## 2018-07-14 DIAGNOSIS — R2689 Other abnormalities of gait and mobility: Secondary | ICD-10-CM

## 2018-07-14 DIAGNOSIS — G8929 Other chronic pain: Secondary | ICD-10-CM

## 2018-07-14 DIAGNOSIS — M25571 Pain in right ankle and joints of right foot: Secondary | ICD-10-CM

## 2018-07-14 NOTE — Therapy (Signed)
[facility] [address]  [place], [place], [address] Phone: [phone]   Fax:  [phone]  Physical Therapy Treatment  Patient Details  Name: Amber Wall MRN: [id number] Date of Birth: Apr 27, 1981 Referring Provider: Frederik Pear, MD (low back & knee) & Landis Martins, DPM (foot)   Encounter Date: 07/14/2018  PT End of Session - 07/14/18 1327    Visit Number  9    Number of Visits  16    Date for PT Re-Evaluation  08/06/18    Authorization Type  Medicaid    Authorization Time Period  06/24/2018-08/04/2018    Authorization - Visit Number  5    Authorization - Number of Visits  12    PT Start Time  0786    PT Stop Time  1400    PT Time Calculation (min)  38 min    Activity Tolerance  Patient tolerated treatment well    Behavior During Therapy  [facility] for tasks assessed/performed       Past Medical History:  Diagnosis Date  . Anxiety   . Bronchitis   . GERD (gastroesophageal reflux disease)   . Lactose intolerance   . Migraine   . Sinus headache     Past Surgical History:  Procedure Laterality Date  . TONSILLECTOMY    . WISDOM TOOTH EXTRACTION      There were no vitals filed for this visit.  Subjective Assessment - 07/14/18 1324    Subjective  Pt. 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      Patient Stated Goals  wants the pain to go out of the R knee; be able to go out dancing without the R knee hurting; be able to walk long enough to go shopping w/o limitation d/t R foot pain    Currently in Pain?  No/denies    Pain Score  0-No pain    Multiple Pain Sites  No                       [facility] Adult PT Treatment/Exercise - 07/14/18 1508      Self-Care   Self-Care  Other Self-Care Comments    Other Self-Care Comments   Reviewed self-STM for glutes with tennis ball or similar small ball   Required cueing to  avoid rolling over spine      Knee/Hip Exercises: Aerobic   Recumbent Bike  L2 x 6 min      Knee/Hip Exercises: Standing   Forward Step Up  Right;10 reps;Step Height: 6"    Forward Step Up Limitations  Focusing on slow eccentric step down    Cues to avoid R knee buckling on step back   Functional Squat  15 reps;3 seconds    Functional Squat Limitations  counter to chair with green TB at knees and relief of R knee pain with green TB and cues for B hip ER/abd      Knee/Hip Exercises: Supine   Bridges with Clamshell  Both;15 reps;Strengthening   with isometric hip abd/ER with green TB at knees      Knee/Hip Exercises: Sidelying   Clams  R clam with green TB x 15      Manual Therapy   Manual Therapy  Soft tissue mobilization    Manual therapy comments  sidelying     Soft tissue mobilization  STM to R superior buttocks in  area of tenderness    followed by stretching and strengthening for this area               PT Short Term Goals - 07/07/18 1030      PT SHORT TERM GOAL #1   Title  Pt will be independent with initial HEP    Status  Achieved      PT SHORT TERM GOAL #2   Title  Pt will report pain no greater than in the R knee 5/10 with activity including squating    Status  Achieved      PT SHORT TERM GOAL #3   Title  Pt will demonstrate improved squatting mechanics to decrease risk for further injury to knee joint    Status  Partially Met      PT SHORT TERM GOAL #4   Title  Pt will demostrate static standing posture with neutral R knee and ankle positioning avoiding genu recurvatum and equinovarus to reduce strain on knee and ankle    Status  Partially Met        PT Long Term Goals - 06/24/18 1001      PT LONG TERM GOAL #1   Title  Patient will be independent with advanced HEP    Status  Partially Met      PT LONG TERM GOAL #2   Title  Pt will report pain no greater than 3/10 in the R knee with activity to improve tolerance to squatting and ADLs including  lifting her daughter    Status  On-going      PT LONG TERM GOAL #3   Title  Pt will demonstrate normal gait pattern with no compensations due to R knee pain to decrease risk for future injury and decrease pain in R foot    Status  On-going      PT LONG TERM GOAL #4   Title  R ankle ROM & strength equivalent to L w/o increased pain to allow for normal gait mechanics    Status  On-going      PT LONG TERM GOAL #5   Title  Pt will report ability to walk through store while shopping w/o increased R foot/ ankle pain >4/10    Status  On-going            Plan - 07/14/18 1328    Clinical Impression Statement  Pt. reporting 50% decrease in back pain since starting therapy.  Did have some R superior buttocks/back pain yesterday while standing in line at MD office and pt. buttocks ttp today however noted good relief following STM to this area.  Pt. primary concern today was R "catching" knee pain while stepping up on surfaces such as curbs/steps.  Did reproduce this pain today with step-ups, and counter squat however able to reduce pain with cues for hip abd/ER into green TB at knees with squat.  Pt. arearing flustered today and requiring verbal cueing frequently throughout session to focus on task to ensure proper technique.  Ended visit pain free thus modalities deferred.  Pt. reporting "ankle exercises" at home are now clear with no need to review.  Pt. progressing toward goals.      PT Treatment/Interventions  ADLs/Self Care Home Management;Cryotherapy;Electrical Stimulation;Iontophoresis '4mg'$ /ml Dexamethasone;Moist Heat;Gait training;Stair training;Functional mobility training;Therapeutic activities;Therapeutic exercise;Ultrasound;Balance training;Neuromuscular re-education;Patient/family education;Orthotic Fit/Training;Manual techniques;Dry needling;Taping;Vasopneumatic Device    Consulted and Agree with Plan of Care  Patient       Patient will benefit from skilled therapeutic intervention in  order to improve the following deficits and impairments:  Abnormal gait, Decreased activity tolerance, Decreased strength, Pain, Increased muscle spasms, Improper body mechanics, Impaired flexibility, Postural dysfunction, Decreased mobility, Difficulty walking, Decreased balance, Decreased safety awareness, Decreased range of motion  Visit Diagnosis: Chronic pain of right knee  Other abnormalities of gait and mobility  Difficulty in walking, not elsewhere classified  Muscle weakness (generalized)  Pain in right ankle and joints of right foot  Stiffness of right ankle, not elsewhere classified  Chronic midline low back pain without sciatica     Problem List Patient Active Problem List   Diagnosis Date Noted  . Insomnia 06/30/2016  . Hypersomnia 06/30/2016  . Asthma 06/30/2016  . Vaginal delivery 01/03/2016  . Moderate persistent asthma with acute exacerbation 11/17/2014  . Pain in soft tissues of limb 02/22/2014  . Problems influencing health status 01/04/2014  . Hemorrhoids 11/24/2013  . Ganglion 07/30/2013  . Anemia 04/04/2013  . Carpal tunnel syndrome 04/04/2013  . Surveillance for birth control, oral contraceptives 04/04/2013  . OBESITY, NOS 12/31/2006  . MIGRAINE, UNSPEC., W/O INTRACTABLE MIGRAINE 12/31/2006  . RHINITIS, ALLERGIC 12/31/2006  . GASTROESOPHAGEAL REFLUX, NO ESOPHAGITIS 12/31/2006    Bess Harvest, PTA 07/14/18 3:27 PM   [facility] [address]  [place], [place], [address] Phone: [phone]   Fax:  [phone]  Name: RENEKA NEBERGALL MRN: [id number] Date of Birth: May 07, 1981

## 2018-07-14 NOTE — Therapy (Signed)
[facility] [address]  [place], [place], [address] Phone: [phone]   Fax:  [phone]  Physical Therapy Treatment  Patient Details  Name: Amber Wall MRN: [id number] Date of Birth: Apr 27, 1981 Referring Provider: Frederik Pear, MD (low back & knee) & Landis Martins, DPM (foot)   Encounter Date: 07/14/2018  PT End of Session - 07/14/18 1327    Visit Number  9    Number of Visits  16    Date for PT Re-Evaluation  08/06/18    Authorization Type  Medicaid    Authorization Time Period  06/24/2018-08/04/2018    Authorization - Visit Number  5    Authorization - Number of Visits  12    PT Start Time  0786    PT Stop Time  1400    PT Time Calculation (min)  38 min    Activity Tolerance  Patient tolerated treatment well    Behavior During Therapy  [facility] for tasks assessed/performed       Past Medical History:  Diagnosis Date  . Anxiety   . Bronchitis   . GERD (gastroesophageal reflux disease)   . Lactose intolerance   . Migraine   . Sinus headache     Past Surgical History:  Procedure Laterality Date  . TONSILLECTOMY    . WISDOM TOOTH EXTRACTION      There were no vitals filed for this visit.  Subjective Assessment - 07/14/18 1324    Subjective  Pt. 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      Patient Stated Goals  wants the pain to go out of the R knee; be able to go out dancing without the R knee hurting; be able to walk long enough to go shopping w/o limitation d/t R foot pain    Currently in Pain?  No/denies    Pain Score  0-No pain    Multiple Pain Sites  No                       [facility] Adult PT Treatment/Exercise - 07/14/18 1508      Self-Care   Self-Care  Other Self-Care Comments    Other Self-Care Comments   Reviewed self-STM for glutes with tennis ball or similar small ball   Required cueing to  avoid rolling over spine      Knee/Hip Exercises: Aerobic   Recumbent Bike  L2 x 6 min      Knee/Hip Exercises: Standing   Forward Step Up  Right;10 reps;Step Height: 6"    Forward Step Up Limitations  Focusing on slow eccentric step down    Cues to avoid R knee buckling on step back   Functional Squat  15 reps;3 seconds    Functional Squat Limitations  counter to chair with green TB at knees and relief of R knee pain with green TB and cues for B hip ER/abd      Knee/Hip Exercises: Supine   Bridges with Clamshell  Both;15 reps;Strengthening   with isometric hip abd/ER with green TB at knees      Knee/Hip Exercises: Sidelying   Clams  R clam with green TB x 15      Manual Therapy   Manual Therapy  Soft tissue mobilization    Manual therapy comments  sidelying     Soft tissue mobilization  STM to R superior buttocks in  area of tenderness    followed by stretching and strengthening for this area             PT Education - 07/14/18 1530    Education Details  HEP update    Person(s) Educated  Patient    Methods  Explanation;Demonstration;Verbal cues;Handout    Comprehension  Verbalized understanding;Returned demonstration;Verbal cues required;Need further instruction       PT Short Term Goals - 07/07/18 1030      PT SHORT TERM GOAL #1   Title  Pt will be independent with initial HEP    Status  Achieved      PT SHORT TERM GOAL #2   Title  Pt will report pain no greater than in the R knee 5/10 with activity including squating    Status  Achieved      PT SHORT TERM GOAL #3   Title  Pt will demonstrate improved squatting mechanics to decrease risk for further injury to knee joint    Status  Partially Met      PT SHORT TERM GOAL #4   Title  Pt will demostrate static standing posture with neutral R knee and ankle positioning avoiding genu recurvatum and equinovarus to reduce strain on knee and ankle    Status  Partially Met        PT Long Term Goals - 06/24/18 1001       PT LONG TERM GOAL #1   Title  Patient will be independent with advanced HEP    Status  Partially Met      PT LONG TERM GOAL #2   Title  Pt will report pain no greater than 3/10 in the R knee with activity to improve tolerance to squatting and ADLs including lifting her daughter    Status  On-going      PT LONG TERM GOAL #3   Title  Pt will demonstrate normal gait pattern with no compensations due to R knee pain to decrease risk for future injury and decrease pain in R foot    Status  On-going      PT LONG TERM GOAL #4   Title  R ankle ROM & strength equivalent to L w/o increased pain to allow for normal gait mechanics    Status  On-going      PT LONG TERM GOAL #5   Title  Pt will report ability to walk through store while shopping w/o increased R foot/ ankle pain >4/10    Status  On-going            Plan - 07/14/18 1328    Clinical Impression Statement  Pt. reporting 50% decrease in back pain since starting therapy.  Did have some R superior buttocks/back pain yesterday while standing in line at MD office and pt. buttocks ttp today however noted good relief following STM to this area.  Pt. primary concern today was R "catching" knee pain while stepping up on surfaces such as curbs/steps.  Did reproduce this pain today with step-ups, and counter squat however able to reduce pain with cues for hip abd/ER into green TB at knees with squat.  Pt. arearing flustered today and requiring verbal cueing frequently throughout session to focus on task to ensure proper technique.  Ended visit pain free thus modalities deferred.  Pt. reporting "ankle exercises" at home are now clear with no need to review.  Pt. progressing toward goals.      PT Treatment/Interventions  ADLs/Self Care Home Management;Cryotherapy;Electrical Stimulation;Iontophoresis  18m/ml Dexamethasone;Moist Heat;Gait training;Stair training;Functional mobility training;Therapeutic activities;Therapeutic  exercise;Ultrasound;Balance training;Neuromuscular re-education;Patient/family education;Orthotic Fit/Training;Manual techniques;Dry needling;Taping;Vasopneumatic Device    Consulted and Agree with Plan of Care  Patient       Patient will benefit from skilled therapeutic intervention in order to improve the following deficits and impairments:  Abnormal gait, Decreased activity tolerance, Decreased strength, Pain, Increased muscle spasms, Improper body mechanics, Impaired flexibility, Postural dysfunction, Decreased mobility, Difficulty walking, Decreased balance, Decreased safety awareness, Decreased range of motion  Visit Diagnosis: Chronic pain of right knee  Other abnormalities of gait and mobility  Difficulty in walking, not elsewhere classified  Muscle weakness (generalized)  Pain in right ankle and joints of right foot  Stiffness of right ankle, not elsewhere classified  Chronic midline low back pain without sciatica     Problem List Patient Active Problem List   Diagnosis Date Noted  . Insomnia 06/30/2016  . Hypersomnia 06/30/2016  . Asthma 06/30/2016  . Vaginal delivery 01/03/2016  . Moderate persistent asthma with acute exacerbation 11/17/2014  . Pain in soft tissues of limb 02/22/2014  . Problems influencing health status 01/04/2014  . Hemorrhoids 11/24/2013  . Ganglion 07/30/2013  . Anemia 04/04/2013  . Carpal tunnel syndrome 04/04/2013  . Surveillance for birth control, oral contraceptives 04/04/2013  . OBESITY, NOS 12/31/2006  . MIGRAINE, UNSPEC., W/O INTRACTABLE MIGRAINE 12/31/2006  . RHINITIS, ALLERGIC 12/31/2006  . GASTROESOPHAGEAL REFLUX, NO ESOPHAGITIS 12/31/2006    MBess Harvest PTA 07/14/18 4:54 PM  [facility] [address] [place] [address]: [phone]  Fax:  [phone] Name: DCHYANE GREERMRN: [id number] of Birth: 107-19-1982

## 2018-07-19 ENCOUNTER — Ambulatory Visit: Payer: Medicaid Other | Admitting: Physical Therapy

## 2018-07-19 ENCOUNTER — Encounter: Payer: Self-pay | Admitting: Physical Therapy

## 2018-07-19 DIAGNOSIS — M545 Low back pain, unspecified: Secondary | ICD-10-CM

## 2018-07-19 DIAGNOSIS — M25671 Stiffness of right ankle, not elsewhere classified: Secondary | ICD-10-CM

## 2018-07-19 DIAGNOSIS — M25561 Pain in right knee: Principal | ICD-10-CM

## 2018-07-19 DIAGNOSIS — M25571 Pain in right ankle and joints of right foot: Secondary | ICD-10-CM

## 2018-07-19 DIAGNOSIS — M6281 Muscle weakness (generalized): Secondary | ICD-10-CM

## 2018-07-19 DIAGNOSIS — R2689 Other abnormalities of gait and mobility: Secondary | ICD-10-CM

## 2018-07-19 DIAGNOSIS — G8929 Other chronic pain: Secondary | ICD-10-CM

## 2018-07-19 DIAGNOSIS — R262 Difficulty in walking, not elsewhere classified: Secondary | ICD-10-CM

## 2018-07-19 NOTE — Therapy (Addendum)
[facility] [address]  [place], [place], [address] Phone: [phone]   Fax:  [phone]  Physical Therapy Treatment  Patient Details  Name: Amber Wall MRN: [id number] Date of Birth: 04/09/1981 Referring Provider: Frederik Pear, MD (low back & knee) & Landis Martins, DPM (foot)   Encounter Date: 07/19/2018  PT End of Session - 07/19/18 1102    Visit Number  10    Number of Visits  16    Date for PT Re-Evaluation  08/06/18    Authorization Type  Medicaid    Authorization Time Period  06/24/2018-08/04/2018    Authorization - Visit Number  6    Authorization - Number of Visits  12    PT Start Time  1102    PT Stop Time  1154    PT Time Calculation (min)  52 min    Activity Tolerance  Patient tolerated treatment well    Behavior During Therapy  [facility] for tasks assessed/performed       Past Medical History:  Diagnosis Date  . Anxiety   . Bronchitis   . GERD (gastroesophageal reflux disease)   . Lactose intolerance   . Migraine   . Sinus headache     Past Surgical History:  Procedure Laterality Date  . TONSILLECTOMY    . WISDOM TOOTH EXTRACTION      There were no vitals filed for this visit.  Subjective Assessment - 07/19/18 1103    Subjective  Pt reporting return of knee pain yesterday along with some foot/ankle pain which is now resolved - unable to indentify trigger other than she had been doing a lot of walking.    Patient Stated Goals  wants the pain to go out of the R knee; be able to go out dancing without the R knee hurting; be able to walk long enough to go shopping w/o limitation d/t R foot pain    Currently in Pain?  Yes    Pain Score  4     Pain Location  Knee    Pain Orientation  Right    Pain Descriptors / Indicators  Aching    Pain Type  Chronic pain    Pain Frequency  Intermittent    Multiple Pain Sites  No         [facility] PT Assessment - 07/19/18 1102      Assessment   Medical  Diagnosis  Low back pain, R knee pain & R peroneal tendinitis    Referring Provider  Frederik Pear, MD (low back & knee) & Landis Martins, DPM (foot)    Next MD Visit  07/20/18 with Dr. Cannon Kettle (pt planning to reschedule d/t work conflict); PRN with Dr. Mayer Camel      AROM   Right Ankle Dorsiflexion  19    Right Ankle Plantar Flexion  50    Right Ankle Inversion  34    Right Ankle Eversion  23    Left Ankle Dorsiflexion  21    Left Ankle Plantar Flexion  60    Left Ankle Inversion  36    Left Ankle Eversion  31      Strength   Right Hip Flexion  4+/5    Right Hip Extension  5/5    Right Hip External Rotation   4+/5    Right Hip Internal Rotation  5/5    Right Hip ABduction  4+/5    Right Hip ADduction  5/5  Left Hip Flexion  4/5    Left Hip Extension  4+/5    Left Hip External Rotation  4+/5    Left Hip Internal Rotation  5/5    Left Hip ABduction  5/5    Left Hip ADduction  5/5    Right Knee Flexion  5/5    Right Knee Extension  5/5    Left Knee Flexion  5/5    Left Knee Extension  5/5    Right Ankle Dorsiflexion  5/5    Right Ankle Plantar Flexion  4+/5    Right Ankle Inversion  4+/5   slight pain with resistance   Right Ankle Eversion  5/5    Left Ankle Dorsiflexion  5/5    Left Ankle Plantar Flexion  4-/5    Left Ankle Inversion  5/5    Left Ankle Eversion  5/5                   [facility] Adult PT Treatment/Exercise - 07/19/18 1102      Exercises   Exercises  Knee/Hip;Ankle      Knee/Hip Exercises: Stretches   ITB Stretch  Right;30 seconds;2 reps   each   ITB Stretch Limitations  standing at wall & lateral sidebending over back of chair - pt preferring latter as it also targets area in low back in need of stretching      Knee/Hip Exercises: Aerobic   Tread Mill  1.2 mph x 6 min       Knee/Hip Exercises: Standing   Functional Squat  15 reps;3 seconds    Functional Squat Limitations  TM rail for UE support      Manual Therapy   Manual Therapy  Soft  tissue mobilization;Other (comment)    Soft tissue mobilization  R ITB, lateral quads & HS with & w/o rolling stick    Other Manual Therapy  Instructed pt in rolling stick/pin to R ITB including lateral quads & HS. Demonstrated use of FR for same area, but pt prferring roller stick.      Ankle Exercises: Standing   Heel Raises  Right;Left;2 seconds    Heel Raises Limitations  R x19, L x 8 - decreased lift and control on R             PT Education - 07/19/18 1154    Education Details  HEP update - ITB rolling/stretching    Person(s) Educated  Patient    Methods  Explanation;Demonstration;Handout    Comprehension  Verbalized understanding;Returned demonstration       PT Short Term Goals - 07/19/18 1109      PT SHORT TERM GOAL #1   Title  Pt will be independent with initial HEP    Status  Achieved      PT SHORT TERM GOAL #2   Title  Pt will report pain no greater than in the R knee 5/10 with activity including squating    Status  Achieved      PT SHORT TERM GOAL #3   Title  Pt will demonstrate improved squatting mechanics to decrease risk for further injury to knee joint    Status  Achieved      PT SHORT TERM GOAL #4   Title  Pt will demostrate static standing posture with neutral R knee and ankle positioning avoiding genu recurvatum and equinovarus to reduce strain on knee and ankle    Status  Achieved        PT Long Term  Goals - 07/19/18 1110      PT LONG TERM GOAL #1   Title  Patient will be independent with advanced HEP    Status  Partially Met      PT LONG TERM GOAL #2   Title  Pt will report pain no greater than 3/10 in the R knee with activity to improve tolerance to squatting and ADLs including lifting her daughter    Status  On-going      PT LONG TERM GOAL #3   Title  Pt will demonstrate normal gait pattern with no compensations due to R knee pain to decrease risk for future injury and decrease pain in R foot    Status  Partially Met      PT LONG TERM  GOAL #4   Title  R ankle ROM & strength equivalent to L w/o increased pain to allow for normal gait mechanics    Status  Partially Met      PT LONG TERM GOAL #5   Title  Pt will report ability to walk through store while shopping w/o increased R foot/ankle pain >4/10    Status  Achieved            Plan - 07/19/18 1202    Clinical Impression Statement  Amber Wall reporting ~85% improvement in R ankle/foot pain with PT, with overall improvements in R knee and back pain as well although noting increased R knee pain for last day or two and reporting "catching" R knee pain. Overall LE strength improving with R ankle now actually testing stronger than L in PF. Pt noting increased ttp/pain with hand placement on R ITB with resisted hip abduction with increased tension/"knots" felt along length of ITB - provided instruction in STM/rolling with rolling pin or foam roller as well as stretching for ITB to address this with pt already noting improvment during PT session. Functionally pt demonstrating improving awareness of posture and body mechanics as related to avoiding genu recurvatum in static stance and proper form for squatting mechanics, although cues still initially necessary to avoid thrusting knees into hyperextension up on return to standing from squat.    Rehab Potential  Good    PT Treatment/Interventions  ADLs/Self Care Home Management;Cryotherapy;Electrical Stimulation;Iontophoresis '4mg'$ /ml Dexamethasone;Moist Heat;Gait training;Stair training;Functional mobility training;Therapeutic activities;Therapeutic exercise;Ultrasound;Balance training;Neuromuscular re-education;Patient/family education;Orthotic Fit/Training;Manual techniques;Dry needling;Taping;Vasopneumatic Device    Consulted and Agree with Plan of Care  Patient       Patient will benefit from skilled therapeutic intervention in order to improve the following deficits and impairments:  Abnormal gait, Decreased activity tolerance,  Decreased strength, Pain, Increased muscle spasms, Improper body mechanics, Impaired flexibility, Postural dysfunction, Decreased mobility, Difficulty walking, Decreased balance, Decreased safety awareness, Decreased range of motion  Visit Diagnosis: Chronic pain of right knee  Other abnormalities of gait and mobility  Difficulty in walking, not elsewhere classified  Muscle weakness (generalized)  Pain in right ankle and joints of right foot  Stiffness of right ankle, not elsewhere classified  Chronic midline low back pain without sciatica     Problem List Patient Active Problem List   Diagnosis Date Noted  . Insomnia 06/30/2016  . Hypersomnia 06/30/2016  . Asthma 06/30/2016  . Vaginal delivery 01/03/2016  . Moderate persistent asthma with acute exacerbation 11/17/2014  . Pain in soft tissues of limb 02/22/2014  . Problems influencing health status 01/04/2014  . Hemorrhoids 11/24/2013  . Ganglion 07/30/2013  . Anemia 04/04/2013  . Carpal tunnel syndrome 04/04/2013  . Surveillance for birth  control, oral contraceptives 04/04/2013  . OBESITY, NOS 12/31/2006  . MIGRAINE, UNSPEC., W/O INTRACTABLE MIGRAINE 12/31/2006  . RHINITIS, ALLERGIC 12/31/2006  . GASTROESOPHAGEAL REFLUX, NO ESOPHAGITIS 12/31/2006    Percival Spanish, PT, MPT 07/19/2018, 12:44 PM  [facility] [address]  [place], [place], [address] Phone: [phone]   Fax:  [phone]  Name: Amber Wall MRN: [id number] Date of Birth: 1981/02/18

## 2018-07-20 ENCOUNTER — Ambulatory Visit: Payer: Medicaid Other | Admitting: Sports Medicine

## 2018-07-22 ENCOUNTER — Ambulatory Visit: Payer: Medicaid Other

## 2018-07-22 DIAGNOSIS — M25671 Stiffness of right ankle, not elsewhere classified: Secondary | ICD-10-CM

## 2018-07-22 DIAGNOSIS — R2689 Other abnormalities of gait and mobility: Secondary | ICD-10-CM

## 2018-07-22 DIAGNOSIS — M25561 Pain in right knee: Principal | ICD-10-CM

## 2018-07-22 DIAGNOSIS — R262 Difficulty in walking, not elsewhere classified: Secondary | ICD-10-CM

## 2018-07-22 DIAGNOSIS — M25571 Pain in right ankle and joints of right foot: Secondary | ICD-10-CM

## 2018-07-22 DIAGNOSIS — G8929 Other chronic pain: Secondary | ICD-10-CM

## 2018-07-22 DIAGNOSIS — M545 Low back pain, unspecified: Secondary | ICD-10-CM

## 2018-07-22 DIAGNOSIS — M6281 Muscle weakness (generalized): Secondary | ICD-10-CM

## 2018-07-22 NOTE — Therapy (Signed)
[facility] [address]  [place], [place], [address] Phone: [phone]   Fax:  [phone]  Physical Therapy Treatment  Patient Details  Name: Amber Wall MRN: [id number] Date of Birth: 1980/12/26 Referring Provider: Frederik Pear, MD (low back & knee) & Landis Martins, DPM (foot)   Encounter Date: 07/22/2018  PT End of Session - 07/22/18 1456    Visit Number  11    Number of Visits  16    Date for PT Re-Evaluation  08/06/18    Authorization Type  Medicaid    Authorization Time Period  06/24/2018-08/04/2018    Authorization - Visit Number  7    Authorization - Number of Visits  12    PT Start Time  1450    PT Stop Time  1528    PT Time Calculation (min)  38 min    Activity Tolerance  Patient tolerated treatment well    Behavior During Therapy  [facility] for tasks assessed/performed       Past Medical History:  Diagnosis Date  . Anxiety   . Bronchitis   . GERD (gastroesophageal reflux disease)   . Lactose intolerance   . Migraine   . Sinus headache     Past Surgical History:  Procedure Laterality Date  . TONSILLECTOMY    . WISDOM TOOTH EXTRACTION      There were no vitals filed for this visit.  Subjective Assessment - 07/22/18 1452    Subjective  Pt. reporting, "I missed my foot doctor appointment", and she attributes this to soreness following work shift.      Patient Stated Goals  wants the pain to go out of the R knee; be able to go out dancing without the R knee hurting; be able to walk long enough to go shopping w/o limitation d/t R foot pain    Currently in Pain?  Yes    Pain Score  1     Pain Location  Knee    Pain Orientation  Right    Pain Descriptors / Indicators  --   "catching"   Pain Type  Chronic pain    Pain Frequency  Intermittent    Aggravating Factors   up and down curbs     Pain Relieving Factors  rest     Pain Score  0   R ankle pain increased to 10/10 after work shift on  Tuesday night    Pain Location  Ankle    Pain Orientation  Right    Pain Type  Chronic pain    Pain Onset  More than a month ago    Pain Frequency  Intermittent    Aggravating Factors   curbs                        [facility] Adult PT Treatment/Exercise - 07/22/18 1504      Knee/Hip Exercises: Stretches   ITB Stretch  Right;30 seconds;2 reps    ITB Stretch Limitations  standing at wall & lateral sidebending over back of chair - pt preferring latter as it also targets area in low back in need of stretching      Knee/Hip Exercises: Aerobic   Recumbent Bike  L2 x 7 min      Knee/Hip Exercises: Standing   Heel Raises  Both;Left;15 reps;3 seconds    Heel Raises Limitations  B con/wt. shift to L on ecc     Hip  Flexion  Left;10 reps;Knee straight;Right;Stengthening    Hip Flexion Limitations  yellow looped TB       Knee/Hip Exercises: Seated   Marching  Right;Left;10 reps    Marching Limitations  with red looped TB at knees     Sit to Sand  15 reps;with UE support   from low machine seat with red TB at knees      Knee/Hip Exercises: Supine   Bridges with Clamshell  Both;15 reps;Strengthening   with isometric hip abd/ER into green TB at knees; 5" hold     Manual Therapy   Manual Therapy  Soft tissue mobilization;Other (comment)    Manual therapy comments  seated     Soft tissue mobilization  STM R ITB, lateral quad, R proximal hip               PT Short Term Goals - 07/19/18 1109      PT SHORT TERM GOAL #1   Title  Pt will be independent with initial HEP    Status  Achieved      PT SHORT TERM GOAL #2   Title  Pt will report pain no greater than in the R knee 5/10 with activity including squating    Status  Achieved      PT SHORT TERM GOAL #3   Title  Pt will demonstrate improved squatting mechanics to decrease risk for further injury to knee joint    Status  Achieved      PT SHORT TERM GOAL #4   Title  Pt will demostrate static standing posture with  neutral R knee and ankle positioning avoiding genu recurvatum and equinovarus to reduce strain on knee and ankle    Status  Achieved        PT Long Term Goals - 07/19/18 1110      PT LONG TERM GOAL #1   Title  Patient will be independent with advanced HEP    Status  Partially Met      PT LONG TERM GOAL #2   Title  Pt will report pain no greater than 3/10 in the R knee with activity to improve tolerance to squatting and ADLs including lifting her daughter    Status  On-going      PT LONG TERM GOAL #3   Title  Pt will demonstrate normal gait pattern with no compensations due to R knee pain to decrease risk for future injury and decrease pain in R foot    Status  Partially Met      PT LONG TERM GOAL #4   Title  R ankle ROM & strength equivalent to L w/o increased pain to allow for normal gait mechanics    Status  Partially Met      PT LONG TERM GOAL #5   Title  Pt will report ability to walk through store while shopping w/o increased R foot/ankle pain >4/10    Status  Achieved            Plan - 07/22/18 1502    Clinical Impression Statement  Joleah reporting much improved R knee pain since last session however remaining knee pain is primary concern today.  Did have some, "catching" R lateral knee pain navigating curbs today.  Pt. ttp in R lateral hip/LE musculature thus addressed with manual therapy.  Session focusing on proximal hip strengthening.  Reproduced R knee pain with sit<>stand which was relieved with addition of hip abd/ER into band at knees.  Pt. verbalizing  plans to purchase new shoes over weekend.  Will continue to progress.      PT Treatment/Interventions  ADLs/Self Care Home Management;Cryotherapy;Electrical Stimulation;Iontophoresis '4mg'$ /ml Dexamethasone;Moist Heat;Gait training;Stair training;Functional mobility training;Therapeutic activities;Therapeutic exercise;Ultrasound;Balance training;Neuromuscular re-education;Patient/family education;Orthotic  Fit/Training;Manual techniques;Dry needling;Taping;Vasopneumatic Device    Consulted and Agree with Plan of Care  Patient       Patient will benefit from skilled therapeutic intervention in order to improve the following deficits and impairments:  Abnormal gait, Decreased activity tolerance, Decreased strength, Pain, Increased muscle spasms, Improper body mechanics, Impaired flexibility, Postural dysfunction, Decreased mobility, Difficulty walking, Decreased balance, Decreased safety awareness, Decreased range of motion  Visit Diagnosis: Chronic pain of right knee  Other abnormalities of gait and mobility  Difficulty in walking, not elsewhere classified  Muscle weakness (generalized)  Pain in right ankle and joints of right foot  Stiffness of right ankle, not elsewhere classified  Chronic midline low back pain without sciatica     Problem List Patient Active Problem List   Diagnosis Date Noted  . Insomnia 06/30/2016  . Hypersomnia 06/30/2016  . Asthma 06/30/2016  . Vaginal delivery 01/03/2016  . Moderate persistent asthma with acute exacerbation 11/17/2014  . Pain in soft tissues of limb 02/22/2014  . Problems influencing health status 01/04/2014  . Hemorrhoids 11/24/2013  . Ganglion 07/30/2013  . Anemia 04/04/2013  . Carpal tunnel syndrome 04/04/2013  . Surveillance for birth control, oral contraceptives 04/04/2013  . OBESITY, NOS 12/31/2006  . MIGRAINE, UNSPEC., W/O INTRACTABLE MIGRAINE 12/31/2006  . RHINITIS, ALLERGIC 12/31/2006  . GASTROESOPHAGEAL REFLUX, NO ESOPHAGITIS 12/31/2006    Bess Harvest, PTA 07/22/18 4:58 PM  [facility] [address]  [place], [place], [address] Phone: [phone]   Fax:  [phone]  Name: SWANNIE MILIUS MRN: [id number] Date of Birth: Apr 20, 1981

## 2018-07-26 ENCOUNTER — Ambulatory Visit: Payer: Medicaid Other | Admitting: Physical Therapy

## 2018-07-26 ENCOUNTER — Encounter: Payer: Self-pay | Admitting: Physical Therapy

## 2018-07-26 DIAGNOSIS — R2689 Other abnormalities of gait and mobility: Secondary | ICD-10-CM

## 2018-07-26 DIAGNOSIS — M545 Low back pain, unspecified: Secondary | ICD-10-CM

## 2018-07-26 DIAGNOSIS — M25561 Pain in right knee: Secondary | ICD-10-CM | POA: Diagnosis not present

## 2018-07-26 DIAGNOSIS — M6281 Muscle weakness (generalized): Secondary | ICD-10-CM

## 2018-07-26 DIAGNOSIS — M25671 Stiffness of right ankle, not elsewhere classified: Secondary | ICD-10-CM

## 2018-07-26 DIAGNOSIS — R262 Difficulty in walking, not elsewhere classified: Secondary | ICD-10-CM

## 2018-07-26 DIAGNOSIS — G8929 Other chronic pain: Secondary | ICD-10-CM

## 2018-07-26 DIAGNOSIS — M25571 Pain in right ankle and joints of right foot: Secondary | ICD-10-CM

## 2018-07-26 NOTE — Therapy (Signed)
[facility] [address]  [place], [place], [address] Phone: [phone]   Fax:  [phone]  Physical Therapy Treatment  Patient Details  Name: Amber Wall MRN: [id number] Date of Birth: 05-Jan-1981 Referring Provider: Frederik Pear, MD (low back & knee) & Landis Martins, DPM (foot)   Encounter Date: 07/26/2018  PT End of Session - 07/26/18 1116    Visit Number  12    Number of Visits  16    Date for PT Re-Evaluation  08/06/18    Authorization Type  Medicaid    Authorization Time Period  06/24/2018-08/04/2018    Authorization - Visit Number  8    Authorization - Number of Visits  12    PT Start Time  1116   pt arrived late   PT Stop Time  1149    PT Time Calculation (min)  33 min    Activity Tolerance  Patient tolerated treatment well    Behavior During Therapy  [facility] for tasks assessed/performed       Past Medical History:  Diagnosis Date  . Anxiety   . Bronchitis   . GERD (gastroesophageal reflux disease)   . Lactose intolerance   . Migraine   . Sinus headache     Past Surgical History:  Procedure Laterality Date  . TONSILLECTOMY    . WISDOM TOOTH EXTRACTION      There were no vitals filed for this visit.  Subjective Assessment - 07/26/18 1119    Subjective  Pt reports she went to [employer] and bought some new sneakers - notes improved stability with walking and driving (pressing the pedals) but does not feel that they have adequate cushioning when she has to stand for long periods at work.    Patient Stated Goals  wants the pain to go out of the R knee; be able to go out dancing without the R knee hurting; be able to walk long enough to go shopping w/o limitation d/t R foot pain    Currently in Pain?  No/denies    Pain Onset  More than a month ago                       [facility] Adult PT Treatment/Exercise - 07/26/18 1116      Exercises   Exercises  Knee/Hip;Ankle      Knee/Hip  Exercises: Stretches   ITB Stretch  Right;Left;30 seconds;2 reps   each   ITB Stretch Limitations  lateral sidebending over back of chair & standing at wall      Knee/Hip Exercises: Aerobic   Nustep  L5 x 5 min (LE only)      Knee/Hip Exercises: Standing   Functional Squat  10 reps;3 seconds;2 sets    Functional Squat Limitations  1st set with red TB at knees, 2nd set w/o TB - cues for proper alignment, even wt shift, avoiding knees past toes & avoiding knee hyperextension upon return to upright stance - miiror used to visual feedback      Ankle Exercises: Standing   Heel Raises  Both;10 reps;3 seconds   3 sets   Heel Raises Limitations  1st set B con/ecc; 2nd set B con/ alt ecc; 3rd set B con/alt ecc off edge of UBE platform               PT Short Term Goals - 07/19/18 1109      PT SHORT TERM GOAL #  1   Title  Pt will be independent with initial HEP    Status  Achieved      PT SHORT TERM GOAL #2   Title  Pt will report pain no greater than in the R knee 5/10 with activity including squating    Status  Achieved      PT SHORT TERM GOAL #3   Title  Pt will demonstrate improved squatting mechanics to decrease risk for further injury to knee joint    Status  Achieved      PT SHORT TERM GOAL #4   Title  Pt will demostrate static standing posture with neutral R knee and ankle positioning avoiding genu recurvatum and equinovarus to reduce strain on knee and ankle    Status  Achieved        PT Long Term Goals - 07/19/18 1110      PT LONG TERM GOAL #1   Title  Patient will be independent with advanced HEP    Status  Partially Met      PT LONG TERM GOAL #2   Title  Pt will report pain no greater than 3/10 in the R knee with activity to improve tolerance to squatting and ADLs including lifting her daughter    Status  On-going      PT LONG TERM GOAL #3   Title  Pt will demonstrate normal gait pattern with no compensations due to R knee pain to decrease risk for future  injury and decrease pain in R foot    Status  Partially Met      PT LONG TERM GOAL #4   Title  R ankle ROM & strength equivalent to L w/o increased pain to allow for normal gait mechanics    Status  Partially Met      PT LONG TERM GOAL #5   Title  Pt will report ability to walk through store while shopping w/o increased R foot/ankle pain >4/10    Status  Achieved            Plan - 07/26/18 1122    Clinical Impression Statement  Jakera arrives to PT with no pain today. Reports benefit from ITB stretching, noting improved flexibility and decreased pain/tension in lateral thigh & hip as well as low back. Initiated HEP review as current episode ending next week, with pt continuing to require frequent cues for proper technique to avoid pain. Pt reporting improved tolerance for strengthening exercises. Treatment session limited to late arrival, but will continue HEP review/update in remaining sessions.    PT Treatment/Interventions  ADLs/Self Care Home Management;Cryotherapy;Electrical Stimulation;Iontophoresis 56m/ml Dexamethasone;Moist Heat;Gait training;Stair training;Functional mobility training;Therapeutic activities;Therapeutic exercise;Ultrasound;Balance training;Neuromuscular re-education;Patient/family education;Orthotic Fit/Training;Manual techniques;Dry needling;Taping;Vasopneumatic Device    Consulted and Agree with Plan of Care  Patient       Patient will benefit from skilled therapeutic intervention in order to improve the following deficits and impairments:  Abnormal gait, Decreased activity tolerance, Decreased strength, Pain, Increased muscle spasms, Improper body mechanics, Impaired flexibility, Postural dysfunction, Decreased mobility, Difficulty walking, Decreased balance, Decreased safety awareness, Decreased range of motion  Visit Diagnosis: Chronic pain of right knee  Other abnormalities of gait and mobility  Difficulty in walking, not elsewhere classified  Muscle  weakness (generalized)  Pain in right ankle and joints of right foot  Stiffness of right ankle, not elsewhere classified  Chronic midline low back pain without sciatica     Problem List Patient Active Problem List   Diagnosis Date Noted  .  Insomnia 06/30/2016  . Hypersomnia 06/30/2016  . Asthma 06/30/2016  . Vaginal delivery 01/03/2016  . Moderate persistent asthma with acute exacerbation 11/17/2014  . Pain in soft tissues of limb 02/22/2014  . Problems influencing health status 01/04/2014  . Hemorrhoids 11/24/2013  . Ganglion 07/30/2013  . Anemia 04/04/2013  . Carpal tunnel syndrome 04/04/2013  . Surveillance for birth control, oral contraceptives 04/04/2013  . OBESITY, NOS 12/31/2006  . MIGRAINE, UNSPEC., W/O INTRACTABLE MIGRAINE 12/31/2006  . RHINITIS, ALLERGIC 12/31/2006  . GASTROESOPHAGEAL REFLUX, NO ESOPHAGITIS 12/31/2006    Percival Spanish, PT, MPT 07/26/2018, 12:09 PM  [facility] [address]  [place], [place], [address] Phone: [phone]   Fax:  [phone]  Name: BRILEY BUMGARNER MRN: [id number] Date of Birth: [birth year]

## 2018-07-28 ENCOUNTER — Ambulatory Visit: Payer: Medicaid Other

## 2018-08-02 ENCOUNTER — Encounter: Payer: Self-pay | Admitting: Physical Therapy

## 2018-08-02 ENCOUNTER — Ambulatory Visit: Payer: Medicaid Other | Admitting: Physical Therapy

## 2018-08-02 DIAGNOSIS — M25671 Stiffness of right ankle, not elsewhere classified: Secondary | ICD-10-CM

## 2018-08-02 DIAGNOSIS — M25561 Pain in right knee: Secondary | ICD-10-CM | POA: Diagnosis not present

## 2018-08-02 DIAGNOSIS — M545 Low back pain, unspecified: Secondary | ICD-10-CM

## 2018-08-02 DIAGNOSIS — M25571 Pain in right ankle and joints of right foot: Secondary | ICD-10-CM

## 2018-08-02 DIAGNOSIS — G8929 Other chronic pain: Secondary | ICD-10-CM

## 2018-08-02 DIAGNOSIS — R262 Difficulty in walking, not elsewhere classified: Secondary | ICD-10-CM

## 2018-08-02 DIAGNOSIS — R2689 Other abnormalities of gait and mobility: Secondary | ICD-10-CM

## 2018-08-02 DIAGNOSIS — M6281 Muscle weakness (generalized): Secondary | ICD-10-CM

## 2018-08-02 NOTE — Therapy (Signed)
[facility] [address]  [place], [place], [address] Phone: [phone]   Fax:  [phone]  Physical Therapy Treatment  Patient Details  Name: Amber Wall MRN: [id number] Date of Birth: 1981-10-09 Referring Provider (PT): Frederik Pear, MD (low back & knee) & Landis Martins, DPM (foot)   Encounter Date: 08/02/2018  PT End of Session - 08/02/18 1018    Visit Number  13    Number of Visits  16    Date for PT Re-Evaluation  08/06/18    Authorization Type  Medicaid    Authorization Time Period  06/24/2018-08/04/2018    Authorization - Visit Number  9    Authorization - Number of Visits  12    PT Start Time  8366    PT Stop Time  1104    PT Time Calculation (min)  46 min    Activity Tolerance  Patient tolerated treatment well    Behavior During Therapy  [facility] for tasks assessed/performed       Past Medical History:  Diagnosis Date  . Anxiety   . Bronchitis   . GERD (gastroesophageal reflux disease)   . Lactose intolerance   . Migraine   . Sinus headache     Past Surgical History:  Procedure Laterality Date  . TONSILLECTOMY    . WISDOM TOOTH EXTRACTION      There were no vitals filed for this visit.  Subjective Assessment - 08/02/18 1021    Subjective  Pt reporting increased R knee pain over the weekend - no known trigger other than standing at work. Also noting exacerbation of chronic bronchitis.    Patient Stated Goals  wants the pain to go out of the R knee; be able to go out dancing without the R knee hurting; be able to walk long enough to go shopping w/o limitation d/t R foot pain    Currently in Pain?  No/denies    Pain Onset  More than a month ago                       [facility] Adult PT Treatment/Exercise - 08/02/18 1018      Exercises   Exercises  Knee/Hip;Ankle      Knee/Hip Exercises: Stretches   Passive Hamstring Stretch  Right;30 seconds;2 reps    Passive Hamstring Stretch  Limitations  With stretch strap around forefoot for incorporate gastroc stretch. VCs required to prevent genu recurvatum during stretch.    Designer, fashion/clothing Limitations  prone with strap with hip extension on folded pillow    ITB Stretch  Right;30 seconds;2 reps    ITB Stretch Limitations  lateral sidebending over back of chair    Piriformis Stretch Limitations  attempted mutiple versions w/o signifcant stretch felt, therefore deferred from HEP    Gastroc Stretch  Right;30 seconds;1 rep    Gastroc Stretch Limitations  at wall    Soleus Stretch  Right;30 seconds;1 rep    Soleus Stretch Limitations  at wall      Knee/Hip Exercises: Aerobic   Nustep  L5 x 5 min (LE only)      Knee/Hip Exercises: Standing   Functional Squat  10 reps;3 seconds;2 sets    Functional Squat Limitations  1st set with blue TB at knees, 2nd set w/o TB - cues for proper alignment, even wt shift, avoiding knees past toes & avoiding knee hyperextension  upon return to upright stance - pt reporting decreased pain w/o TB      Knee/Hip Exercises: Supine   Bridges with Clamshell  Both;15 reps;Strengthening      Knee/Hip Exercises: Sidelying   Clams  B clam with blue TB x 15             PT Education - 08/02/18 1101    Education Details  HEP review/update    Person(s) Educated  Patient    Methods  Explanation;Demonstration;Handout    Comprehension  Verbalized understanding;Returned demonstration       PT Short Term Goals - 07/19/18 1109      PT SHORT TERM GOAL #1   Title  Pt will be independent with initial HEP    Status  Achieved      PT SHORT TERM GOAL #2   Title  Pt will report pain no greater than in the R knee 5/10 with activity including squating    Status  Achieved      PT SHORT TERM GOAL #3   Title  Pt will demonstrate improved squatting mechanics to decrease risk for further injury to knee joint    Status  Achieved      PT SHORT TERM GOAL #4   Title  Pt  will demostrate static standing posture with neutral R knee and ankle positioning avoiding genu recurvatum and equinovarus to reduce strain on knee and ankle    Status  Achieved        PT Long Term Goals - 07/19/18 1110      PT LONG TERM GOAL #1   Title  Patient will be independent with advanced HEP    Status  Partially Met      PT LONG TERM GOAL #2   Title  Pt will report pain no greater than 3/10 in the R knee with activity to improve tolerance to squatting and ADLs including lifting her daughter    Status  On-going      PT LONG TERM GOAL #3   Title  Pt will demonstrate normal gait pattern with no compensations due to R knee pain to decrease risk for future injury and decrease pain in R foot    Status  Partially Met      PT LONG TERM GOAL #4   Title  R ankle ROM & strength equivalent to L w/o increased pain to allow for normal gait mechanics    Status  Partially Met      PT LONG TERM GOAL #5   Title  Pt will report ability to walk through store while shopping w/o increased R foot/ankle pain >4/10    Status  Achieved            Plan - 08/02/18 1026    Clinical Impression Statement  Focused on HEP review/update in preparation for upcoming transition to HEP at end of current Medicaid authorization on Wed 10/2, with emphasis on knee exercises as pt reporting this remains her biggest issue. Pt admitting to only selective completion of existing HEP having "forgotten" about some of the HEP handouts, therefore reviewed and condensed relevant knee stretches/exercises into a single handout with pt able to perform return demonstration of all exercises appropriately. Will address any remaining concerns on next visit, and proceed with discharge/transition to HEP.    Rehab Potential  Good    PT Treatment/Interventions  ADLs/Self Care Home Management;Cryotherapy;Electrical Stimulation;Iontophoresis '4mg'$ /ml Dexamethasone;Moist Heat;Gait training;Stair training;Functional mobility  training;Therapeutic activities;Therapeutic exercise;Ultrasound;Balance training;Neuromuscular re-education;Patient/family education;Orthotic Fit/Training;Manual techniques;Dry needling;Taping;Vasopneumatic  Device    Consulted and Agree with Plan of Care  Patient       Patient will benefit from skilled therapeutic intervention in order to improve the following deficits and impairments:  Abnormal gait, Decreased activity tolerance, Decreased strength, Pain, Increased muscle spasms, Improper body mechanics, Impaired flexibility, Postural dysfunction, Decreased mobility, Difficulty walking, Decreased balance, Decreased safety awareness, Decreased range of motion  Visit Diagnosis: Chronic pain of right knee  Other abnormalities of gait and mobility  Difficulty in walking, not elsewhere classified  Muscle weakness (generalized)  Pain in right ankle and joints of right foot  Stiffness of right ankle, not elsewhere classified  Chronic midline low back pain without sciatica     Problem List Patient Active Problem List   Diagnosis Date Noted  . Insomnia 06/30/2016  . Hypersomnia 06/30/2016  . Asthma 06/30/2016  . Vaginal delivery 01/03/2016  . Moderate persistent asthma with acute exacerbation 11/17/2014  . Pain in soft tissues of limb 02/22/2014  . Problems influencing health status 01/04/2014  . Hemorrhoids 11/24/2013  . Ganglion 07/30/2013  . Anemia 04/04/2013  . Carpal tunnel syndrome 04/04/2013  . Surveillance for birth control, oral contraceptives 04/04/2013  . OBESITY, NOS 12/31/2006  . MIGRAINE, UNSPEC., W/O INTRACTABLE MIGRAINE 12/31/2006  . RHINITIS, ALLERGIC 12/31/2006  . GASTROESOPHAGEAL REFLUX, NO ESOPHAGITIS 12/31/2006    Percival Spanish, PT, MPT 08/02/2018, 12:24 PM  [facility] [address]  [place], [place], [address] Phone: [phone]   Fax:  [phone]  Name: Amber Wall MRN:  [id number] Date of Birth: [birth year]

## 2018-08-04 ENCOUNTER — Ambulatory Visit: Payer: Medicaid Other | Attending: Orthopedic Surgery | Admitting: Physical Therapy

## 2018-08-04 ENCOUNTER — Encounter: Payer: Self-pay | Admitting: Physical Therapy

## 2018-08-04 DIAGNOSIS — M25571 Pain in right ankle and joints of right foot: Secondary | ICD-10-CM

## 2018-08-04 DIAGNOSIS — M25671 Stiffness of right ankle, not elsewhere classified: Secondary | ICD-10-CM | POA: Diagnosis present

## 2018-08-04 DIAGNOSIS — R262 Difficulty in walking, not elsewhere classified: Secondary | ICD-10-CM | POA: Diagnosis present

## 2018-08-04 DIAGNOSIS — G8929 Other chronic pain: Secondary | ICD-10-CM

## 2018-08-04 DIAGNOSIS — M545 Low back pain, unspecified: Secondary | ICD-10-CM

## 2018-08-04 DIAGNOSIS — M25561 Pain in right knee: Secondary | ICD-10-CM | POA: Diagnosis present

## 2018-08-04 DIAGNOSIS — R2689 Other abnormalities of gait and mobility: Secondary | ICD-10-CM | POA: Diagnosis present

## 2018-08-04 DIAGNOSIS — M6281 Muscle weakness (generalized): Secondary | ICD-10-CM | POA: Diagnosis present

## 2018-08-04 NOTE — Therapy (Signed)
[facility] [address]  [place], [place], [address] Phone: [phone]   Fax:  [phone]  Physical Therapy Treatment  Patient Details  Name: Amber Wall MRN: [id number] Date of Birth: 11-23-1980 Referring Provider (PT): Frederik Pear, MD (low back & knee) & Landis Martins, DPM (foot)   Encounter Date: 08/04/2018  PT End of Session - 08/04/18 1058    Visit Number  14    Number of Visits  16    Date for PT Re-Evaluation  08/06/18    Authorization Type  Medicaid    Authorization Time Period  06/24/2018-08/04/2018    Authorization - Visit Number  10    Authorization - Number of Visits  12    PT Start Time  3295    PT Stop Time  1122    PT Time Calculation (min)  24 min    Activity Tolerance  Patient tolerated treatment well    Behavior During Therapy  [facility] for tasks assessed/performed       Past Medical History:  Diagnosis Date  . Anxiety   . Bronchitis   . GERD (gastroesophageal reflux disease)   . Lactose intolerance   . Migraine   . Sinus headache     Past Surgical History:  Procedure Laterality Date  . TONSILLECTOMY    . WISDOM TOOTH EXTRACTION      There were no vitals filed for this visit.  Subjective Assessment - 08/04/18 1100    Subjective  Pt reporting no issues since last visit.    How long can you stand comfortably?  8-9 hours     How long can you walk comfortably?  2 hours    Patient Stated Goals  wants the pain to go out of the R knee; be able to go out dancing without the R knee hurting; be able to walk long enough to go shopping w/o limitation d/t R foot pain    Currently in Pain?  No/denies    Pain Onset  More than a month ago         [facility] PT Assessment - 08/04/18 1058      Assessment   Medical Diagnosis  Low back pain, R knee pain & R peroneal tendinitis    Referring Provider (PT)  Frederik Pear, MD (low back & knee) & Landis Martins, DPM (foot)      AROM   Right Ankle  Dorsiflexion  20    Right Ankle Plantar Flexion  54    Right Ankle Inversion  35    Right Ankle Eversion  30    Left Ankle Dorsiflexion  21    Left Ankle Plantar Flexion  60    Left Ankle Inversion  36    Left Ankle Eversion  31      Strength   Right Hip Flexion  5/5    Right Hip Extension  5/5    Right Hip External Rotation   5/5    Right Hip Internal Rotation  5/5    Right Hip ABduction  5/5    Right Hip ADduction  5/5    Left Hip Flexion  4/5    Left Hip Extension  5/5    Left Hip External Rotation  4+/5    Left Hip Internal Rotation  5/5    Left Hip ABduction  5/5    Left Hip ADduction  5/5    Right Knee Flexion  5/5    Right Knee  Extension  5/5    Left Knee Flexion  5/5    Left Knee Extension  5/5    Right Ankle Dorsiflexion  5/5    Right Ankle Plantar Flexion  5/5    Right Ankle Inversion  5/5    Right Ankle Eversion  5/5    Left Ankle Dorsiflexion  5/5    Left Ankle Plantar Flexion  5/5    Left Ankle Inversion  5/5    Left Ankle Eversion  5/5                   [facility] Adult PT Treatment/Exercise - 08/04/18 1058      Exercises   Exercises  Knee/Hip;Ankle      Knee/Hip Exercises: Aerobic   Recumbent Bike  L2 x 6 min      Ankle Exercises: Seated   Other Seated Ankle Exercises  R 4 way ankle with green TB x 10               PT Short Term Goals - 07/19/18 1109      PT SHORT TERM GOAL #1   Title  Pt will be independent with initial HEP    Status  Achieved      PT SHORT TERM GOAL #2   Title  Pt will report pain no greater than in the R knee 5/10 with activity including squating    Status  Achieved      PT SHORT TERM GOAL #3   Title  Pt will demonstrate improved squatting mechanics to decrease risk for further injury to knee joint    Status  Achieved      PT SHORT TERM GOAL #4   Title  Pt will demostrate static standing posture with neutral R knee and ankle positioning avoiding genu recurvatum and equinovarus to reduce strain on knee  and ankle    Status  Achieved        PT Long Term Goals - 08/04/18 1102      PT LONG TERM GOAL #1   Title  Patient will be independent with advanced HEP    Status  Achieved      PT LONG TERM GOAL #2   Title  Pt will report pain no greater than 3/10 in the R knee with activity to improve tolerance to squatting and ADLs including lifting her daughter    Status  Achieved      PT LONG TERM GOAL #3   Title  Pt will demonstrate normal gait pattern with no compensations due to R knee pain to decrease risk for future injury and decrease pain in R foot    Status  Achieved      PT LONG TERM GOAL #4   Title  R ankle ROM & strength equivalent to L w/o increased pain to allow for normal gait mechanics    Status  Achieved      PT LONG TERM GOAL #5   Title  Pt will report ability to walk through store while shopping w/o increased R foot/ankle pain >4/10    Status  Achieved            Plan - 08/04/18 1129    Clinical Impression Statement  Amber Wall very pleased with her progress with no pain today and pt noting that most of the time she doesn't even think about the pain any more.  Ankle ROM and proximal flexibility now WNL and B LE strength now grossly 5/5. Pt demonstrating normal gait  pattern and reporting good tolerance for daily mobility and ambulation only noting occasional issue with curb negotiation. All goals met for this episode, therefore will proceed with discharge from PT at this time.    Rehab Potential  Good    PT Treatment/Interventions  ADLs/Self Care Home Management;Cryotherapy;Electrical Stimulation;Iontophoresis '4mg'$ /ml Dexamethasone;Moist Heat;Gait training;Stair training;Functional mobility training;Therapeutic activities;Therapeutic exercise;Ultrasound;Balance training;Neuromuscular re-education;Patient/family education;Orthotic Fit/Training;Manual techniques;Dry needling;Taping;Vasopneumatic Device    PT Next Visit Plan  Discharge    Consulted and Agree with Plan of Care   Patient       Patient will benefit from skilled therapeutic intervention in order to improve the following deficits and impairments:  Abnormal gait, Decreased activity tolerance, Decreased strength, Pain, Increased muscle spasms, Improper body mechanics, Impaired flexibility, Postural dysfunction, Decreased mobility, Difficulty walking, Decreased balance, Decreased safety awareness, Decreased range of motion  Visit Diagnosis: Chronic pain of right knee  Other abnormalities of gait and mobility  Difficulty in walking, not elsewhere classified  Muscle weakness (generalized)  Pain in right ankle and joints of right foot  Stiffness of right ankle, not elsewhere classified  Chronic midline low back pain without sciatica     Problem List Patient Active Problem List   Diagnosis Date Noted  . Insomnia 06/30/2016  . Hypersomnia 06/30/2016  . Asthma 06/30/2016  . Vaginal delivery 01/03/2016  . Moderate persistent asthma with acute exacerbation 11/17/2014  . Pain in soft tissues of limb 02/22/2014  . Problems influencing health status 01/04/2014  . Hemorrhoids 11/24/2013  . Ganglion 07/30/2013  . Anemia 04/04/2013  . Carpal tunnel syndrome 04/04/2013  . Surveillance for birth control, oral contraceptives 04/04/2013  . OBESITY, NOS 12/31/2006  . MIGRAINE, UNSPEC., W/O INTRACTABLE MIGRAINE 12/31/2006  . RHINITIS, ALLERGIC 12/31/2006  . GASTROESOPHAGEAL REFLUX, NO ESOPHAGITIS 12/31/2006    PHYSICAL THERAPY DISCHARGE SUMMARY  Visits from Start of Care: 14  Current functional level related to goals / functional outcomes:   Refer to above clinical impression.    Remaining deficits:   As above.   Education / Equipment:   HEP  Plan: Patient agrees to discharge.  Patient goals were met. Patient is being discharged due to meeting the stated rehab goals.  ?????      Percival Spanish, PT, MPT 08/04/2018, 11:36 AM  [facility] [address]  [place], [place], [address] Phone: [phone]   Fax:  [phone]  Name: Amber Wall MRN: [id number] Date of Birth: 1980-11-10

## 2018-08-10 ENCOUNTER — Ambulatory Visit: Payer: Medicaid Other | Admitting: Sports Medicine

## 2018-10-18 ENCOUNTER — Ambulatory Visit: Payer: Medicaid Other | Admitting: Pulmonary Disease

## 2018-10-18 NOTE — Progress Notes (Deleted)
Synopsis: Referred in 10/2018 for asthma  Subjective:   PATIENT ID: Amber Wall A Bonello GENDER: female DOB: 1981/04/21, MRN: [id number]   HPI  No chief complaint on file.  Ms. Amber Wall is a 37 year old female never smoker with history of asthma, allergic rhinitis and reflux who presents as a new consultation for asthma management.  [facility]: Complains of cough is been productive for 1 week.  Pulmicort was added in addition to high-dose Symbicort.  Referred to pulmonary.  *** Social History: Never smoker  Environmental exposures: ***  I have personally reviewed patient's past medical/family/social history, allergies, current medications.  Past Medical History:  Diagnosis Date  . Anxiety   . Bronchitis   . GERD (gastroesophageal reflux disease)   . Lactose intolerance   . Migraine   . Sinus headache      Family History  Problem Relation Age of Onset  . Diverticulitis Mother   . Irritable bowel syndrome Mother   . Lactose intolerance Mother   . Migraines Mother   . Heart disease Father   . Diabetes Father   . Aneurysm Maternal Grandmother   . Dementia Maternal Grandfather   . Heart disease Maternal Grandfather      Social History   Occupational History  . Not on file  Tobacco Use  . Smoking status: Never Smoker  . Smokeless tobacco: Never Used  Substance and Sexual Activity  . Alcohol use: No  . Drug use: No  . Sexual activity: Yes    Partners: Male    Birth control/protection: None    Allergies  Allergen Reactions  . Amoxicillin Rash    Has patient had a PCN reaction causing immediate rash, facial/tongue/throat swelling, SOB or lightheadedness with hypotension: Yes Has patient had a PCN reaction causing severe rash involving mucus membranes or skin necrosis: No Has patient had a PCN reaction that required hospitalization No Has patient had a PCN reaction occurring within the last 10  years: Yes If all of the above answers are "NO", then may proceed with Cephalosporin use.     Outpatient Medications Prior to Visit  Medication Sig Dispense Refill  . albuterol (PROVENTIL HFA;VENTOLIN HFA) 108 (90 Base) MCG/ACT inhaler Inhale 1-2 puffs into the lungs every 6 (six) hours as needed for wheezing or shortness of breath.    Marland Kitchen. albuterol (PROVENTIL) (2.5 MG/3ML) 0.083% nebulizer solution Take 2.5 mg by nebulization every 6 (six) hours as needed for wheezing or shortness of breath.    . diclofenac (FLECTOR) 1.3 % PTCH Place 1 patch onto the skin 2 (two) times daily. 5 patch 1  . etonogestrel-ethinyl estradiol (NUVARING) 0.12-0.015 MG/24HR vaginal ring Insert vaginally and leave in place for 3 consecutive weeks, then remove for 1 week. 1 each 12  . ibuprofen (ADVIL,MOTRIN) 800 MG tablet Take 800 mg by mouth every 8 (eight) hours as needed.    . pantoprazole (PROTONIX) 40 MG tablet Take 80 mg by mouth daily.    . phentermine 37.5 MG capsule Take 37.5 mg by mouth every morning.    . ranitidine (ZANTAC) 150 MG tablet Take 2 tabs by mouth twice a day    . sertraline (ZOLOFT) 50 MG tablet Take 1 tablet (50 mg total) by mouth daily. Take 25 mg by mouth daily for the first 7 days, then switch to 50 mg daily. 30 tablet 2  . zolpidem (AMBIEN) 10 MG tablet Take 10 mg by mouth at bedtime as needed.  3   Facility-Administered Medications Prior to Visit  Medication Dose Route Frequency Provider Last Rate Last Dose  . triamcinolone acetonide (KENALOG) 10 MG/ML injection 10 mg  10 mg Other Once Asencion Islam, DPM        ROS   Objective:  There were no vitals filed for this visit.    Physical Exam General: Well-appearing, no acute distress HENT: [place], AT, OP clear, MMM Eyes: EOMI, no scleral icterus Respiratory: Clear to auscultation bilaterally.  No crackles, wheezing or rales Cardiovascular: RRR, -M/R/G, no JVD GI: BS+, soft, nontender Extremities:-Edema,-tenderness Neuro: AAO x4,  CNII-XII grossly intact Skin: Intact, no rashes or bruising Psych: Normal mood, normal affect  Chest imaging: None on file  PFT: None on file    Assessment & Plan:   #***  Discussion: 37 year old female who presents with***  No orders of the defined types were placed in this encounter. No orders of the defined types were placed in this encounter.   No follow-ups on file.   [name], MD [facility] 10/18/2018 9:47 AM  Personal pager: [phone] If unanswered, please page CCM On-call: [phone]

## 2018-11-15 ENCOUNTER — Telehealth: Payer: Self-pay

## 2018-11-15 ENCOUNTER — Encounter: Payer: Self-pay | Admitting: Gastroenterology

## 2018-11-15 ENCOUNTER — Encounter: Payer: Self-pay | Admitting: Obstetrics and Gynecology

## 2018-11-15 ENCOUNTER — Other Ambulatory Visit (HOSPITAL_COMMUNITY)
Admission: RE | Admit: 2018-11-15 | Discharge: 2018-11-15 | Disposition: A | Discharge status: Home / Self Care | Payer: Medicaid Other | Source: Ambulatory Visit | Attending: Obstetrics and Gynecology | Admitting: Obstetrics and Gynecology

## 2018-11-15 ENCOUNTER — Ambulatory Visit: Payer: Medicaid Other | Admitting: Obstetrics and Gynecology

## 2018-11-15 VITALS — BP 109/76 | HR 72 | Wt 250.6 lb

## 2018-11-15 DIAGNOSIS — Z113 Encounter for screening for infections with a predominantly sexual mode of transmission: Secondary | ICD-10-CM | POA: Diagnosis present

## 2018-11-15 MED ORDER — SERTRALINE HCL 50 MG PO TABS: 50.0000 mg | ORAL_TABLET | Freq: Every day | ORAL | 2 refills | Status: DC

## 2018-11-15 MED ORDER — NYSTATIN-TRIAMCINOLONE 100000-0.1 UNIT/GM-% EX OINT: 1.0000 "application " | TOPICAL_OINTMENT | Freq: Two times a day (BID) | CUTANEOUS | 1 refills | Status: DC

## 2018-11-15 NOTE — Progress Notes (Signed)
38 yo P2012 here for STI screen and evaluation of breast pruritis. Patient denies any abnormal discharge or pelvic pain. She is sexually active with condoms. She states that she desires STI testing every 6 months. Patient is currently still breastfeeding her 38 year old and reports scaly pruritic nipples bilaterally.  Past Medical History:  Diagnosis Date  . Anxiety   . Bronchitis   . GERD (gastroesophageal reflux disease)   . Lactose intolerance   . Migraine   . Sinus headache    Past Surgical History:  Procedure Laterality Date  . TONSILLECTOMY    . WISDOM TOOTH EXTRACTION     Family History  Problem Relation Age of Onset  . Diverticulitis Mother   . Irritable bowel syndrome Mother   . Lactose intolerance Mother   . Migraines Mother   . Heart disease Father   . Diabetes Father   . Aneurysm Maternal Grandmother   . Dementia Maternal Grandfather   . Heart disease Maternal Grandfather    Social History   Tobacco Use  . Smoking status: Never Smoker  . Smokeless tobacco: Never Used  Substance Use Topics  . Alcohol use: No  . Drug use: No   ROS See pertinent in HPI  Blood pressure 109/76, pulse 72, weight 250 lb 9.6 oz (113.7 kg), unknown if currently breastfeeding. GENERAL: Well-developed, well-nourished female in no acute distress.  BREASTS: Symmetric in size. No palpable masses or lymphadenopathy, skin changes, or nipple drainage. Dry, scaly area involving areolar region. No erythema PELVIC: Normal external female genitalia. Vagina is pink and rugated.  Normal discharge.  EXTREMITIES: No cyanosis, clubbing, or edema, 2+ distal pulses.  A/P 38 yo here for routine STI screen and with breast yeast infection - vaginal culture collected - Rx mycolog provided - Patient is considering paraguard IUD for contraception - Normal pap smear 04/2018 - RTC prn

## 2018-11-15 NOTE — Progress Notes (Signed)
Pt is here for STD check and breast itchiness. Pt is currently breast feeding.

## 2018-11-15 NOTE — Telephone Encounter (Signed)
Pharmacy notified  PA# [id number] Effective 11/15/2018-11/13/2019 Nystatin-tria.Marland KitchenMarland KitchenMarland Kitchen

## 2018-11-16 LAB — CERVICOVAGINAL ANCILLARY ONLY
BACTERIAL VAGINITIS: NEGATIVE
CANDIDA VAGINITIS: NEGATIVE
Chlamydia: NEGATIVE
Neisseria Gonorrhea: NEGATIVE
Trichomonas: NEGATIVE

## 2018-11-23 ENCOUNTER — Encounter: Payer: Self-pay | Admitting: Gastroenterology

## 2018-11-23 ENCOUNTER — Ambulatory Visit: Payer: Medicaid Other | Admitting: Gastroenterology

## 2018-11-23 VITALS — BP 106/74 | HR 76 | Ht 61.75 in | Wt 250.0 lb

## 2018-11-23 DIAGNOSIS — K219 Gastro-esophageal reflux disease without esophagitis: Secondary | ICD-10-CM

## 2018-11-23 DIAGNOSIS — K449 Diaphragmatic hernia without obstruction or gangrene: Secondary | ICD-10-CM | POA: Diagnosis not present

## 2018-11-23 DIAGNOSIS — R1310 Dysphagia, unspecified: Secondary | ICD-10-CM

## 2018-11-23 DIAGNOSIS — Z6841 Body Mass Index (BMI) 40.0 and over, adult: Secondary | ICD-10-CM

## 2018-11-23 NOTE — Progress Notes (Signed)
Chief Complaint: Dysphagia   Referring Provider:     Self    HPI:     Amber Wall is a 38 y.o. female presenting to the [facility] for evaluation of dysphagia.  This is been ongoing for a couple of years but worsened over the last year or so, pointing to her suprasternal notch and mid sternum. Sxs were initially intermittent and now with nearly every solid meal, requiring her to chew food thoroughly and wash down with liquids. No liquid dysphagia. No food impactions/ER evaluation. No hx of melena, hematochezia.  No associated weight loss, nausea, vomiting, early satiety.  She was evaluated by Dr. Bosie Clos in 2014 with EGD and esophagram.  Able to review prior EGD after my appointment with her, and this was actually done in 2014 for reflux symptoms.  No dilation performed at that time.  Esophagram 08/2013 with sliding hiatal hernia, no reflux, no esophageal strictures.  She then started to have dysphagia in 2017 with repeat esophagram in 03/2016 notable for small sliding hiatal hernia and mild to moderate reflux and otherwise normal esophagus without strictures.  She was last seen by Dr. Bosie Clos in 03/2016 for reflux, dysphagia, and vomiting at that time.  Treated with Protonix 40 mg p.o. twice daily, dietary modifications, OTC Tums or Zantac as needed.    She endorses a Hx of reflux and previously prescribed Protonix and Zantac, which she took but has since stopped taking approx 6-12 months ago due to little perceived clinical benefit.   Mother with hx of eosphageal strictures requiring dilation, but o/w no GI malignancy, IBD, liver disease.  Endoscopic history: - EGD (08/2013, Dr. Bosie Clos, done for reflux symptoms): Normal esophagus, medium-sized hiatal hernia, normal stomach and duodenum  Past Medical History:  Diagnosis Date  . Anemia   . Anxiety   . Bronchitis   . Esophageal stricture   . GERD (gastroesophageal reflux disease)   . Insomnia   . Lactose  intolerance   . Migraine   . Sinus headache   . Sleep apnea    not currently on CPAP machine      Past Surgical History:  Procedure Laterality Date  . ESOPHAGOGASTRODUODENOSCOPY  2017   Dr Bosie Clos   . TONSILLECTOMY    . WISDOM TOOTH EXTRACTION     Family History  Problem Relation Age of Onset  . Diverticulitis Mother   . Irritable bowel syndrome Mother   . Lactose intolerance Mother   . Migraines Mother   . Colon polyps Mother   . Heart disease Father   . Diabetes Father   . Aneurysm Maternal Grandmother   . Dementia Maternal Grandfather   . Heart disease Maternal Grandfather   . Colon cancer Neg Hx   . Esophageal cancer Neg Hx    Social History   Tobacco Use  . Smoking status: Never Smoker  . Smokeless tobacco: Never Used  Substance Use Topics  . Alcohol use: No  . Drug use: No   Current Outpatient Medications  Medication Sig Dispense Refill  . albuterol (PROVENTIL HFA;VENTOLIN HFA) 108 (90 Base) MCG/ACT inhaler Inhale 1-2 puffs into the lungs every 6 (six) hours as needed for wheezing or shortness of breath.    Marland Kitchen albuterol (PROVENTIL) (2.5 MG/3ML) 0.083% nebulizer solution Take 2.5 mg by nebulization every 6 (six) hours as needed for wheezing or shortness of breath.    Marland Kitchen ibuprofen (ADVIL,MOTRIN) 800 MG tablet Take 800  mg by mouth every 8 (eight) hours as needed.    . nystatin-triamcinolone ointment (MYCOLOG) Apply 1 application topically 2 (two) times daily. 30 g 1  . pantoprazole (PROTONIX) 40 MG tablet Take 80 mg by mouth daily.    . phentermine 37.5 MG capsule Take 37.5 mg by mouth every morning.    . ranitidine (ZANTAC) 150 MG tablet Take 2 tabs by mouth twice a day    . sertraline (ZOLOFT) 50 MG tablet Take 1 tablet (50 mg total) by mouth daily. Take 25 mg by mouth daily for the first 7 days, then switch to 50 mg daily. 30 tablet 2  . zolpidem (AMBIEN) 10 MG tablet Take 10 mg by mouth at bedtime as needed.  3  . diclofenac (FLECTOR) 1.3 % PTCH Place 1 patch  onto the skin 2 (two) times daily. (Patient not taking: Reported on 11/15/2018) 5 patch 1   Current Facility-Administered Medications  Medication Dose Route Frequency Provider Last Rate Last Dose  . triamcinolone acetonide (KENALOG) 10 MG/ML injection 10 mg  10 mg Other Once Asencion Islam, DPM       Allergies  Allergen Reactions  . Amoxicillin Rash    Has patient had a PCN reaction causing immediate rash, facial/tongue/throat swelling, SOB or lightheadedness with hypotension: Yes Has patient had a PCN reaction causing severe rash involving mucus membranes or skin necrosis: No Has patient had a PCN reaction that required hospitalization No Has patient had a PCN reaction occurring within the last 10 years: Yes If all of the above answers are "NO", then may proceed with Cephalosporin use.     Review of Systems: All systems reviewed and negative except where noted in HPI.     Physical Exam:    Wt Readings from Last 3 Encounters:  11/23/18 250 lb (113.4 kg)  11/15/18 250 lb 9.6 oz (113.7 kg)  04/08/18 245 lb 8 oz (111.4 kg)    Ht 5' 1.75" (1.568 m)   Wt 250 lb (113.4 kg)   BMI 46.10 kg/m  Constitutional:  Pleasant, in no acute distress. Psychiatric: Normal mood and affect. Behavior is normal. EENT: Pupils normal.  Conjunctivae are normal. No scleral icterus. Neck supple. No cervical LAD. Cardiovascular: Normal rate, regular rhythm. No edema Pulmonary/chest: Effort normal and breath sounds normal. No wheezing, rales or rhonchi. Abdominal: Soft, nondistended, nontender. Bowel sounds active throughout. There are no masses palpable. No hepatomegaly. Neurological: Alert and oriented to person place and time. Skin: Skin is warm and dry. No rashes noted.   ASSESSMENT AND PLAN;   Amber Wall is a 38 y.o. female presenting with:  1) Dysphagia: Solid food dysphagia for nearly 3 years, worse over the last year and now occurring with nearly every meal.  Last EGD in 2014 was  otherwise unrevealing and has had 2 barium esophagram completed in 2014 and 2017 without esophageal strictures or motility disorder.  Reflux noted on the latter esophagram.  Discussed DDX to include EOE particular given her underlying history of asthma and will proceed as below:  - EGD with possible dilation - Advised patient to cut food into small pieces, eat small bites, chew food thoroughly and with plenty of liquids to avoid food impaction.  2) Reflux: Radiographic evidence of reflux on esophagram in 2017.  She otherwise does not endorse typical reflux symptoms and has since stopped taking all acid suppression therapy.  Can evaluate for evidence of reflux at time of EGD as above.  3) Hiatal hernia: Radiographic evidence  of hiatal hernia along with small hernia noted on previous EGD.  Will evaluate for size of hernia and LES laxity at time of EGD as above.  4) Obesity: BMI 46.  Counseled on dietary modifications to include decreased sugar, soda, etc. Intake..  The indications, risks, and benefits of EGD were explained to the patient in detail. Risks include but are not limited to bleeding, perforation, adverse reaction to medications, and cardiopulmonary compromise. Sequelae include but are not limited to the possibility of surgery, hositalization, and mortality. The patient verbalized understanding and wished to proceed. All questions answered, referred to scheduler. Further recommendations pending results of the exam.     Shellia CleverlyVito V Miran Kautzman, DO, FACG  11/23/2018, 4:00 PM   Quitman LivingsHassan, Sami, MD

## 2018-11-23 NOTE — Patient Instructions (Signed)
If you are age 37 or older, your body mass index should be between 23-30. Your Body mass index is 46.1 kg/m. If this is out of the aforementioned range listed, please consider follow up with your Primary Care Provider.  If you are age 64 or younger, your body mass index should be between 19-25. Your Body mass index is 46.1 kg/m. If this is out of the aformentioned range listed, please consider follow up with your Primary Care Provider.   You have been scheduled for an endoscopy. Please follow written instructions given to you at your visit today. If you use inhalers (even only as needed), please bring them with you on the day of your procedure. Your physician has requested that you go to www.startemmi.com and enter the access code given to you at your visit today. This web site gives a general overview about your procedure. However, you should still follow specific instructions given to you by our office regarding your preparation for the procedure.  It was a pleasure to see you today!  Vito Cirigliano, D.O.

## 2018-11-25 ENCOUNTER — Telehealth: Payer: Self-pay

## 2018-11-25 NOTE — Telephone Encounter (Signed)
TC from pt states even though Nystatin PA was approved, [facility] system still requests PA. Called rx to [facility]. Rx approved no issues.

## 2018-11-26 ENCOUNTER — Telehealth: Payer: Self-pay | Admitting: *Deleted

## 2018-11-26 NOTE — Telephone Encounter (Signed)
Called to confirm that patient is NOT on phentermine. We needed to make sure of this in preparation for her upcoming endoscopy with propofol sedation with Dr Barron Alvine on 11/30/2018. Patient indicates that she has NOT been on this medication since at least early November 2019. I will take that medication off of the medication list.

## 2018-11-30 ENCOUNTER — Encounter: Payer: Self-pay | Admitting: Gastroenterology

## 2018-11-30 ENCOUNTER — Ambulatory Visit (AMBULATORY_SURGERY_CENTER): Payer: Medicaid Other | Admitting: Gastroenterology

## 2018-11-30 VITALS — BP 104/84 | HR 94 | Temp 97.5°F | Resp 16 | Ht 61.0 in | Wt 250.0 lb

## 2018-11-30 DIAGNOSIS — K319 Disease of stomach and duodenum, unspecified: Secondary | ICD-10-CM

## 2018-11-30 DIAGNOSIS — K2100 Gastro-esophageal reflux disease with esophagitis, without bleeding: Secondary | ICD-10-CM

## 2018-11-30 DIAGNOSIS — K222 Esophageal obstruction: Secondary | ICD-10-CM | POA: Diagnosis not present

## 2018-11-30 DIAGNOSIS — R1310 Dysphagia, unspecified: Secondary | ICD-10-CM | POA: Diagnosis not present

## 2018-11-30 MED ORDER — SODIUM CHLORIDE 0.9 % IV SOLN: 500.0000 mL | Freq: Once | INTRAVENOUS | Status: DC

## 2018-11-30 MED ORDER — PANTOPRAZOLE SODIUM 40 MG PO TBEC: 40.0000 mg | DELAYED_RELEASE_TABLET | Freq: Two times a day (BID) | ORAL | 0 refills | Status: DC

## 2018-11-30 NOTE — Patient Instructions (Signed)
Follow soft diet today.  Prescription sent to [facility], [facility].       YOU HAD AN ENDOSCOPIC PROCEDURE TODAY AT THE [facility]:   Refer to the procedure report that was given to you for any specific questions about what was found during the examination.  If the procedure report does not answer your questions, please call your gastroenterologist to clarify.  If you requested that your care partner not be given the details of your procedure findings, then the procedure report has been included in a sealed envelope for you to review at your convenience later.  YOU SHOULD EXPECT: Some feelings of bloating in the abdomen. Passage of more gas than usual.  Walking can help get rid of the air that was put into your GI tract during the procedure and reduce the bloating. If you had a lower endoscopy (such as a colonoscopy or flexible sigmoidoscopy) you may notice spotting of blood in your stool or on the toilet paper. If you underwent a bowel prep for your procedure, you may not have a normal bowel movement for a few days.  Please Note:  You might notice some irritation and congestion in your nose or some drainage.  This is from the oxygen used during your procedure.  There is no need for concern and it should clear up in a day or so.  SYMPTOMS TO REPORT IMMEDIATELY:    Following upper endoscopy (EGD)  Vomiting of blood or coffee ground material  New chest pain or pain under the shoulder blades  Painful or persistently difficult swallowing  New shortness of breath  Fever of 100F or higher  Black, tarry-looking stools  For urgent or emergent issues, a gastroenterologist can be reached at any hour by calling [phone].   DIET:  We do recommend a small meal at first, but then you may proceed to your regular diet.  Drink plenty of fluids but you should avoid alcoholic beverages for 24 hours.  ACTIVITY:  You should plan to take it easy for the rest of today and you should NOT  DRIVE or use heavy machinery until tomorrow (because of the sedation medicines used during the test).    FOLLOW UP: Our staff will call the number listed on your records the next business day following your procedure to check on you and address any questions or concerns that you may have regarding the information given to you following your procedure. If we do not reach you, we will leave a message.  However, if you are feeling well and you are not experiencing any problems, there is no need to return our call.  We will assume that you have returned to your regular daily activities without incident.  If any biopsies were taken you will be contacted by phone or by letter within the next 1-3 weeks.  Please call us at [phone] if you have not heard about the biopsies in 3 weeks.    SIGNATURES/CONFIDENTIALITY: You and/or your care partner have signed paperwork which will be entered into your electronic medical record.  These signatures attest to the fact that that the information above on your After Visit Summary has been reviewed and is understood.  Full responsibility of the confidentiality of this discharge information lies with you and/or your care-partner.

## 2018-11-30 NOTE — Op Note (Signed)
[facility] Patient Name: Ryli Standlee Procedure Date: 11/30/2018 3:46 PM MRN: [id number] Endoscopist: Doristine Locks , MD Age: 38 Referring MD:  Date of Birth: 11/01/1981 Gender: Female Account #: [id number] Procedure:                Upper GI endoscopy Indications:              Dysphagia, Suspected esophageal reflux Medicines:                Monitored Anesthesia Care Procedure:                Pre-Anesthesia Assessment:                           - Prior to the procedure, a History and Physical                            was performed, and patient medications and                            allergies were reviewed. The patient's tolerance of                            previous anesthesia was also reviewed. The risks                            and benefits of the procedure and the sedation                            options and risks were discussed with the patient.                            All questions were answered, and informed consent                            was obtained. Prior Anticoagulants: The patient has                            taken no previous anticoagulant or antiplatelet                            agents. ASA Grade Assessment: III - A patient with                            severe systemic disease. After reviewing the risks                            and benefits, the patient was deemed in                            satisfactory condition to undergo the procedure.                           After obtaining informed consent, the endoscope was  passed under direct vision. Throughout the                            procedure, the patient's blood pressure, pulse, and                            oxygen saturations were monitored continuously. The                            Model GIF-HQ190 [id number]) scope was introduced                            through the mouth, and advanced to the second part                            of duodenum.  The upper GI endoscopy was                            accomplished without difficulty. The patient                            tolerated the procedure well. Scope In: Scope Out: Findings:                 Esophagogastric landmarks were identified: the                            Z-line was found at 33 cm, the gastroesophageal                            junction was found at 33 cm and the site of hiatal                            narrowing was found at 36 cm from the incisors.                           A 3 cm hiatal hernia was present.                           The gastroesophageal flap valve was visualized                            endoscopically and classified as Hill Grade IV (no                            fold, wide open lumen, hiatal hernia present).                           One benign-appearing, intrinsic moderate stenosis                            was found 33 cm from the incisors. This stenosis  measured 1 cm (in length). The stenosis was                            traversed. A TTS dilator was passed through the                            scope. Dilation with a 13.5-14.5-15.5 mm balloon                            dilator was performed to 15.5 mm with out mucosal                            rent. A larger TTS dilator was passed through the                            scope. Dilation with an 18-19-20 mm balloon dilator                            was performed to 19 mm. The dilation site was                            examined and showed mild mucosal disruption. This                            was then biopsied with a cold forceps for further                            fracturing of the ring. Estimated blood loss was                            minimal.                           The upper third of the esophagus and middle third                            of the esophagus were normal.                           LA Grade A (one or more mucosal breaks less than 5                             mm, not extending between tops of 2 mucosal folds)                            esophagitis with no bleeding was found 33 cm from                            the incisors.                           Localized mildly erythematous mucosa without  bleeding was found in the gastric antrum. Biopsies                            were taken with a cold forceps for Helicobacter                            pylori testing. Estimated blood loss was minimal.                           The gastric fundus and gastric body were normal.                            Biopsies were taken with a cold forceps for                            Helicobacter pylori testing. Estimated blood loss                            was minimal.                           The duodenal bulb, first portion of the duodenum                            and second portion of the duodenum were normal. Complications:            No immediate complications. Estimated Blood Loss:     Estimated blood loss was minimal. Impression:               - Esophagogastric landmarks identified.                           - 3 cm hiatal hernia.                           - Gastroesophageal flap valve classified as Hill                            Grade IV (no fold, wide open lumen, hiatal hernia                            present).                           - Benign-appearing esophageal stenosis. Clinical                            appearance consistent with benign peptic stricture.                            Dilated with 19 mm TTS balloon then fractured with                            cold forceps.                           -  Normal upper third of esophagus and middle third                            of esophagus.                           - LA Grade A reflux esophagitis.                           - Erythematous mucosa in the antrum. Biopsied.                           - Normal gastric fundus and gastric body.  Biopsied.                           - Normal duodenal bulb, first portion of the                            duodenum and second portion of the duodenum. Recommendation:           - Patient has a contact number available for                            emergencies. The signs and symptoms of potential                            delayed complications were discussed with the                            patient. Return to normal activities tomorrow.                            Written discharge instructions were provided to the                            patient.                           - Soft diet today.                           - Continue present medications.                           - Await pathology results.                           - Use Protonix (pantoprazole) 40 mg PO BID for 8                            weeks to promote mucosal healing of the                            esophagitis, then titrate to lowest effective dose  to control reflux symptoms. Doristine Locks, MD 11/30/2018 4:18:48 PM

## 2018-12-01 ENCOUNTER — Telehealth: Payer: Self-pay | Admitting: *Deleted

## 2018-12-01 ENCOUNTER — Telehealth: Payer: Self-pay

## 2018-12-01 NOTE — Telephone Encounter (Signed)
Left message on f/u call 

## 2018-12-01 NOTE — Telephone Encounter (Signed)
First follow up call attempt. Reached voicemail with number identified.  Message left to call with questions or concerns. 

## 2018-12-03 ENCOUNTER — Encounter: Payer: Self-pay | Admitting: Gastroenterology

## 2018-12-19 ENCOUNTER — Other Ambulatory Visit: Payer: Self-pay | Admitting: Gastroenterology

## 2019-01-06 ENCOUNTER — Ambulatory Visit: Payer: Medicaid Other | Admitting: Podiatry

## 2019-01-13 ENCOUNTER — Ambulatory Visit: Payer: Medicaid Other | Admitting: Podiatry

## 2019-01-20 ENCOUNTER — Ambulatory Visit: Payer: Medicaid Other | Admitting: Podiatry

## 2019-04-13 ENCOUNTER — Telehealth: Payer: Self-pay

## 2019-04-13 NOTE — Telephone Encounter (Signed)
Returned call, pt stated unknown to her... her Nuvaring fell out 2 days ago. Pt has not had sex in the timeframe that it has been out. Advised pt that she can pick up one from the office.

## 2019-04-22 ENCOUNTER — Encounter: Payer: Self-pay | Admitting: Obstetrics

## 2019-04-22 ENCOUNTER — Ambulatory Visit (INDEPENDENT_AMBULATORY_CARE_PROVIDER_SITE_OTHER): Payer: Medicaid Other | Admitting: Obstetrics

## 2019-04-22 DIAGNOSIS — Z3044 Encounter for surveillance of vaginal ring hormonal contraceptive device: Secondary | ICD-10-CM | POA: Diagnosis not present

## 2019-04-22 NOTE — Progress Notes (Signed)
Pt is on the phone preparing for virtual visit with provider. She is currently using the [place] for contraception, she would like to discuss other options.

## 2019-04-22 NOTE — Progress Notes (Signed)
    TELEHEALTH GYNECOLOGY VIRTUAL VIDEO VISIT ENCOUNTER NOTE  Provider location: [facility] [place]   I connected with Amber Wall on 04/22/19 at  9:15 AM EDT by [place] Video Encounter at home and verified that I am speaking with the correct person using two identifiers.   I discussed the limitations, risks, security and privacy concerns of performing an evaluation and management service by telephone and the availability of in person appointments. I also discussed with the patient that there may be a patient responsible charge related to this service. The patient expressed understanding and agreed to proceed.   History:  Amber Wall is a 38 y.o. [phone] female being evaluated today for problem with NuvaRing coming out with intercourse and sometimes with removal of tampons. She denies any abnormal vaginal discharge, bleeding, pelvic pain or other concerns.       Past Medical History:  Diagnosis Date  . Anemia   . Anxiety   . Bronchitis   . Esophageal stricture   . GERD (gastroesophageal reflux disease)   . Insomnia   . Lactose intolerance   . Migraine   . Sinus headache   . Sleep apnea    not currently on CPAP machine    Past Surgical History:  Procedure Laterality Date  . ESOPHAGOGASTRODUODENOSCOPY  2014   Dr Michail Sermon. [facility]  . TONSILLECTOMY    . WISDOM TOOTH EXTRACTION     The following portions of the patient's history were reviewed and updated as appropriate: allergies, current medications, past family history, past medical history, past social history, past surgical history and problem list.   Health Maintenance:  Normal pap and negative HRHPV on 04-08-2018.   Review of Systems:  Pertinent items noted in HPI and remainder of comprehensive ROS otherwise negative.  Physical Exam:   General:  Alert, oriented and cooperative. Patient appears to be in no acute distress.  Mental Status: Normal mood and affect. Normal behavior. Normal  judgment and thought content.   Respiratory: Normal respiratory effort, no problems with respiration noted  Rest of physical exam deferred due to type of encounter  Labs and Imaging No results found for this or any previous visit (from the past 336 hour(s)). No results found.     Assessment and Plan:     1. Encounter for surveillance of vaginal ring hormonal contraceptive device - recommended deep insertion of NuvaRing under the symphysis and removing NuvaRing during intercourse.    Follow up in 3 months for Annual.   I discussed the assessment and treatment plan with the patient. The patient was provided an opportunity to ask questions and all were answered. The patient agreed with the plan and demonstrated an understanding of the instructions.   The patient was advised to call back or seek an in-person evaluation/go to the ED if the symptoms worsen or if the condition fails to improve as anticipated.  I provided 10 minutes of face-to-face time during this encounter.   Baltazar Najjar, [place] for [facility], [facility] [phone]

## 2019-05-03 ENCOUNTER — Other Ambulatory Visit: Payer: Self-pay

## 2019-05-03 ENCOUNTER — Ambulatory Visit: Payer: Medicaid Other | Admitting: Obstetrics & Gynecology

## 2019-05-03 VITALS — BP 123/84 | HR 82 | Wt 252.0 lb

## 2019-05-03 DIAGNOSIS — Z3202 Encounter for pregnancy test, result negative: Secondary | ICD-10-CM

## 2019-05-03 DIAGNOSIS — Z30017 Encounter for initial prescription of implantable subdermal contraceptive: Secondary | ICD-10-CM

## 2019-05-03 DIAGNOSIS — Z01812 Encounter for preprocedural laboratory examination: Secondary | ICD-10-CM

## 2019-05-03 LAB — POCT URINE PREGNANCY: Preg Test, Ur: NEGATIVE

## 2019-05-03 MED ORDER — ETONOGESTREL 68 MG ~~LOC~~ IMPL
68.0000 mg | DRUG_IMPLANT | Freq: Once | SUBCUTANEOUS | Status: AC
  Administered 2019-05-03: 68 mg via SUBCUTANEOUS

## 2019-05-03 NOTE — Progress Notes (Signed)
GYNECOLOGY OFFICE PROCEDURE NOTE  Amber Wall is a 38 y.o. [id number] here for Nexplanon insertion.  Last pap smear was on 04/2018 and was normal.  No other gynecologic concerns. Switch from NuvaRing, still in place today Patient's last menstrual period was 04/03/2019 (exact date).  Nexplanon Insertion Procedure Patient identified, informed consent performed, consent signed.   Patient does understand that irregular bleeding is a very common side effect of this medication. She was advised to have backup contraception for one week after placement. Pregnancy test in clinic today was negative.  Appropriate time out taken.  Patient's left arm was prepped and draped in the usual sterile fashion. The ruler used to measure and mark insertion area.  Patient was prepped with alcohol swab and then injected with 3 ml of 1% lidocaine.  She was prepped with betadine, Nexplanon removed from packaging,  Device confirmed in needle, then inserted full length of needle and withdrawn per handbook instructions. Nexplanon was able to palpated in the patient's arm; patient palpated the insert herself. There was minimal blood loss.  Patient insertion site covered with gauze and a pressure bandage to reduce any bruising.  The patient tolerated the procedure well and was given post procedure instructions.       Woodroe Mode, MD Attending Obstetrician & Gynecologist, [place] [facility] [place]  05/03/2019

## 2019-05-03 NOTE — Patient Instructions (Signed)
Etonogestrel implant What is this medicine? ETONOGESTREL (et oh noe JES trel) is a contraceptive (birth control) device. It is used to prevent pregnancy. It can be used for up to 3 years. This medicine may be used for other purposes; ask your health care provider or pharmacist if you have questions. COMMON BRAND NAME(S): Implanon, Nexplanon What should I tell my health care provider before I take this medicine? They need to know if you have any of these conditions:  abnormal vaginal bleeding  blood vessel disease or blood clots  breast, cervical, endometrial, ovarian, liver, or uterine cancer  diabetes  gallbladder disease  heart disease or recent heart attack  high blood pressure  high cholesterol or triglycerides  kidney disease  liver disease  migraine headaches  seizures  stroke  tobacco smoker  an unusual or allergic reaction to etonogestrel, anesthetics or antiseptics, other medicines, foods, dyes, or preservatives  pregnant or trying to get pregnant  breast-feeding How should I use this medicine? This device is inserted just under the skin on the inner side of your upper arm by a health care professional. Talk to your pediatrician regarding the use of this medicine in children. Special care may be needed. Overdosage: If you think you have taken too much of this medicine contact a poison control center or emergency room at once. NOTE: This medicine is only for you. Do not share this medicine with others. What if I miss a dose? This does not apply. What may interact with this medicine? Do not take this medicine with any of the following medications:  amprenavir  fosamprenavir This medicine may also interact with the following medications:  acitretin  aprepitant  armodafinil  bexarotene  bosentan  carbamazepine  certain medicines for fungal infections like fluconazole, ketoconazole, itraconazole and voriconazole  certain medicines to treat  hepatitis, HIV or AIDS  cyclosporine  felbamate  griseofulvin  lamotrigine  modafinil  oxcarbazepine  phenobarbital  phenytoin  primidone  rifabutin  rifampin  rifapentine  St. John's wort  topiramate This list may not describe all possible interactions. Give your health care provider a list of all the medicines, herbs, non-prescription drugs, or dietary supplements you use. Also tell them if you smoke, drink alcohol, or use illegal drugs. Some items may interact with your medicine. What should I watch for while using this medicine? This product does not protect you against HIV infection (AIDS) or other sexually transmitted diseases. You should be able to feel the implant by pressing your fingertips over the skin where it was inserted. Contact your doctor if you cannot feel the implant, and use a non-hormonal birth control method (such as condoms) until your doctor confirms that the implant is in place. Contact your doctor if you think that the implant may have broken or become bent while in your arm. You will receive a user card from your health care provider after the implant is inserted. The card is a record of the location of the implant in your upper arm and when it should be removed. Keep this card with your health records. What side effects may I notice from receiving this medicine? Side effects that you should report to your doctor or health care professional as soon as possible:  allergic reactions like skin rash, itching or hives, swelling of the face, lips, or tongue  breast lumps, breast tissue changes, or discharge  breathing problems  changes in emotions or moods  if you feel that the implant may have broken or   bent while in your arm  high blood pressure  pain, irritation, swelling, or bruising at the insertion site  scar at site of insertion  signs of infection at the insertion site such as fever, and skin redness, pain or discharge  signs and  symptoms of a blood clot such as breathing problems; changes in vision; chest pain; severe, sudden headache; pain, swelling, warmth in the leg; trouble speaking; sudden numbness or weakness of the face, arm or leg  signs and symptoms of liver injury like dark yellow or brown urine; general ill feeling or flu-like symptoms; light-colored stools; loss of appetite; nausea; right upper belly pain; unusually weak or tired; yellowing of the eyes or skin  unusual vaginal bleeding, discharge Side effects that usually do not require medical attention (report to your doctor or health care professional if they continue or are bothersome):  acne  breast pain or tenderness  headache  irregular menstrual bleeding  nausea This list may not describe all possible side effects. Call your doctor for medical advice about side effects. You may report side effects to FDA at 1-800-FDA-1088. Where should I keep my medicine? This drug is given in a hospital or clinic and will not be stored at home. NOTE: This sheet is a summary. It may not cover all possible information. If you have questions about this medicine, talk to your doctor, pharmacist, or health care provider.  2020 Elsevier/Gold Standard (2017-09-08 14:11:42)  

## 2019-06-09 ENCOUNTER — Other Ambulatory Visit: Payer: Self-pay

## 2019-06-09 ENCOUNTER — Other Ambulatory Visit (HOSPITAL_COMMUNITY)
Admission: RE | Admit: 2019-06-09 | Discharge: 2019-06-09 | Disposition: A | Discharge status: Home / Self Care | Payer: Medicaid Other | Source: Ambulatory Visit | Attending: Obstetrics and Gynecology | Admitting: Obstetrics and Gynecology

## 2019-06-09 ENCOUNTER — Ambulatory Visit (INDEPENDENT_AMBULATORY_CARE_PROVIDER_SITE_OTHER): Payer: Medicaid Other

## 2019-06-09 DIAGNOSIS — N898 Other specified noninflammatory disorders of vagina: Secondary | ICD-10-CM | POA: Insufficient documentation

## 2019-06-09 DIAGNOSIS — Z113 Encounter for screening for infections with a predominantly sexual mode of transmission: Secondary | ICD-10-CM

## 2019-06-09 NOTE — Progress Notes (Signed)
SUBJECTIVE:  38 y.o. female complains of white vaginal discharge for a few days per pt. Denies abnormal vaginal bleeding or significant pelvic pain or fever. No UTI symptoms. Pt requests all STD blood work today.   OBJECTIVE:  She appears well, afebrile. Urine dipstick: not done.  ASSESSMENT:  Vaginal Discharge    PLAN:  STD blood work completed. GC, chlamydia, trichomonas, BVAG, CVAG probe sent to lab. Treatment: To be determined once lab results are received ROV prn if symptoms persist or worsen. Download MyChart

## 2019-06-11 LAB — HIV ANTIBODY (ROUTINE TESTING W REFLEX): HIV Screen 4th Generation wRfx: NONREACTIVE

## 2019-06-11 LAB — HEPATITIS C ANTIBODY: Hep C Virus Ab: <0.1 s/co ratio (ref 0.0–0.9)

## 2019-06-11 LAB — RPR: RPR Ser Ql: REACTIVE — AB

## 2019-06-11 LAB — HEPATITIS B SURFACE ANTIGEN: Hepatitis B Surface Ag: NEGATIVE

## 2019-06-11 LAB — RPR, QUANT+TP ABS (REFLEX)
Rapid Plasma Reagin, Quant: 1:1 {titer} — ABNORMAL HIGH
T Pallidum Abs: NONREACTIVE

## 2019-06-13 LAB — CERVICOVAGINAL ANCILLARY ONLY
Bacterial vaginitis: NEGATIVE
Candida vaginitis: NEGATIVE
Chlamydia: POSITIVE — AB
Neisseria Gonorrhea: NEGATIVE
Trichomonas: NEGATIVE

## 2019-06-14 ENCOUNTER — Other Ambulatory Visit: Payer: Self-pay | Admitting: Obstetrics

## 2019-06-14 DIAGNOSIS — A749 Chlamydial infection, unspecified: Secondary | ICD-10-CM

## 2019-06-14 MED ORDER — CEFIXIME 400 MG PO CAPS: 400.0000 mg | ORAL_CAPSULE | Freq: Once | ORAL | 0 refills | Status: AC

## 2019-06-14 MED ORDER — AZITHROMYCIN 500 MG PO TABS: 1000.0000 mg | ORAL_TABLET | Freq: Once | ORAL | 0 refills | Status: AC

## 2020-02-17 ENCOUNTER — Ambulatory Visit (INDEPENDENT_AMBULATORY_CARE_PROVIDER_SITE_OTHER): Payer: Medicaid Other | Admitting: Family Medicine

## 2020-02-17 ENCOUNTER — Encounter: Payer: Self-pay | Admitting: Family Medicine

## 2020-02-17 ENCOUNTER — Other Ambulatory Visit: Payer: Self-pay

## 2020-02-17 VITALS — BP 121/78 | HR 88 | Ht 61.34 in | Wt 267.4 lb

## 2020-02-17 DIAGNOSIS — Z3046 Encounter for surveillance of implantable subdermal contraceptive: Secondary | ICD-10-CM | POA: Diagnosis not present

## 2020-02-17 MED ORDER — ETONOGESTREL-ETHINYL ESTRADIOL 0.12-0.015 MG/24HR VA RING: VAGINAL_RING | VAGINAL | 12 refills | Status: DC

## 2020-02-17 NOTE — Progress Notes (Signed)
GYN presents for Nexplanon Removal.  Device was placed 05/03/2019.  C/o significant weight gains.

## 2020-02-17 NOTE — Progress Notes (Signed)
     GYNECOLOGY OFFICE PROCEDURE NOTE  Amber Wall is a 39 y.o. [id number] here for Nexplanon removal.   Nexplanon Removal Patient identified, informed consent performed, consent signed. Appropriate time out taken. Nexplanon site identified.  Area prepped in usual sterile fashon. Two ml of 2% lidocaine was used to anesthetize the area at the distal end of the implant. A small stab incision was made right beside the implant on the distal portion.  The Nexplanon rod was grasped using hemostats and removed without difficulty.  There was minimal blood loss. There were no complications. Steri-strips were applied over the small incision.  A pressure bandage was applied to reduce any bruising.  The patient tolerated the procedure well and was given post procedure instructions.  Patient is planning to use Nuvaring for contraception, a prescription was sent.  Venora Maples [facility], [place] Fellow

## 2020-07-04 ENCOUNTER — Telehealth: Payer: Self-pay | Admitting: Pulmonary Disease

## 2020-07-04 ENCOUNTER — Other Ambulatory Visit: Payer: Self-pay | Admitting: Pulmonary Disease

## 2020-07-04 ENCOUNTER — Other Ambulatory Visit: Payer: Self-pay

## 2020-07-04 ENCOUNTER — Ambulatory Visit (INDEPENDENT_AMBULATORY_CARE_PROVIDER_SITE_OTHER): Payer: Medicaid Other | Admitting: Pulmonary Disease

## 2020-07-04 ENCOUNTER — Encounter: Payer: Self-pay | Admitting: Pulmonary Disease

## 2020-07-04 DIAGNOSIS — J4541 Moderate persistent asthma with (acute) exacerbation: Secondary | ICD-10-CM | POA: Diagnosis not present

## 2020-07-04 DIAGNOSIS — J209 Acute bronchitis, unspecified: Secondary | ICD-10-CM | POA: Diagnosis not present

## 2020-07-04 MED ORDER — AZITHROMYCIN 250 MG PO TABS: ORAL_TABLET | ORAL | 0 refills | Status: DC

## 2020-07-04 MED ORDER — ALBUTEROL SULFATE HFA 108 (90 BASE) MCG/ACT IN AERS: 1.0000 | INHALATION_SPRAY | Freq: Four times a day (QID) | RESPIRATORY_TRACT | 3 refills | Status: DC | PRN

## 2020-07-04 MED ORDER — ALBUTEROL SULFATE HFA 108 (90 BASE) MCG/ACT IN AERS: 2.0000 | INHALATION_SPRAY | Freq: Four times a day (QID) | RESPIRATORY_TRACT | 2 refills | Status: DC | PRN

## 2020-07-04 MED ORDER — ALBUTEROL SULFATE HFA 108 (90 BASE) MCG/ACT IN AERS
1.0000 | INHALATION_SPRAY | Freq: Four times a day (QID) | RESPIRATORY_TRACT | 3 refills | Status: DC | PRN
Start: 2020-07-04 — End: 2020-07-04

## 2020-07-04 MED ORDER — BUDESONIDE-FORMOTEROL FUMARATE 80-4.5 MCG/ACT IN AERO: 2.0000 | INHALATION_SPRAY | Freq: Every day | RESPIRATORY_TRACT | 11 refills | Status: DC

## 2020-07-04 MED ORDER — DEXTROMETHORPHAN HBR 15 MG/5ML PO SYRP: 5.0000 mL | ORAL_SOLUTION | Freq: Three times a day (TID) | ORAL | 0 refills | Status: DC | PRN

## 2020-07-04 MED ORDER — PROMETHAZINE-CODEINE 6.25-10 MG/5ML PO SYRP: 5.0000 mL | ORAL_SOLUTION | Freq: Three times a day (TID) | ORAL | 0 refills | Status: DC | PRN

## 2020-07-04 NOTE — Assessment & Plan Note (Signed)
Likely viral.  She is convinced that this is RSV but none of the kids were sick. Too late for Covid testing now she did not have fevers or loss of taste or smell She is 50% improved We will hold off on chest x-ray unless she has persistent symptoms

## 2020-07-04 NOTE — Addendum Note (Signed)
Addended by: Benjie Karvonen R on: 07/04/2020 11:55 AM   Modules accepted: Orders

## 2020-07-04 NOTE — Patient Instructions (Signed)
Treat as acute bronchitis Z-Pak Complete course of prednisone Tussin cough syrup for 2 weeks Refill on albuterol [facility]I Rx for Symbicort 80/4.5 - take 2 puffs twice daily as needed for wheezing  Letter for work
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
## 2020-07-04 NOTE — Addendum Note (Signed)
Addended by: Benjie Karvonen R on: 07/04/2020 04:44 PM   Modules accepted: Orders

## 2020-07-04 NOTE — Addendum Note (Signed)
Addended by: Oretha Milch on: 07/04/2020 12:41 PM   Modules accepted: Orders

## 2020-07-04 NOTE — Assessment & Plan Note (Signed)
Treat as acute asthmatic bronchitis, not convinced that she has true persistent asthma, so does not need maintenance meds, this is likely viral laryngo ,bronchitis  Z-Pak Complete course of prednisone Tussin cough syrup for 2 weeks Refill on albuterol [facility]I Rx for Symbicort 80/4.5 - take 2 puffs twice daily as needed for wheezing  Letter for work

## 2020-07-04 NOTE — Telephone Encounter (Signed)
Spoke with pharmacy RX has been changed. Nothing further needed at this time

## 2020-07-11 ENCOUNTER — Telehealth: Payer: Self-pay | Admitting: Pulmonary Disease

## 2020-07-11 MED ORDER — PROMETHAZINE-CODEINE 6.25-10 MG/5ML PO SYRP
5.0000 mL | ORAL_SOLUTION | Freq: Three times a day (TID) | ORAL | 0 refills | Status: DC | PRN
Start: 2020-07-11 — End: 2020-09-04

## 2020-07-11 NOTE — Telephone Encounter (Signed)
Called [facility] to send in rx and they are closed for lunch- [facility]

## 2020-07-11 NOTE — Addendum Note (Signed)
Addended by: Christen Butter on: 07/11/2020 04:23 PM   Modules accepted: Orders

## 2020-07-11 NOTE — Telephone Encounter (Signed)
Spoke with the pt  She states needing new rx for cough syrup-phenergan with codeine  She states that we sent to [facility] on 07/04/20 for 180 ml  Her medicaid will only allow to fill #105 ml at a time  She is now requesting the other remainder of her medication to be sent to [facility]  Please advise thanks

## 2020-07-11 NOTE — Telephone Encounter (Signed)
OK to send in 75 cc's only  of phenergan with Codeine. Thanks

## 2020-07-11 NOTE — Addendum Note (Signed)
Addended by: Christen Butter on: 07/11/2020 02:02 PM   Modules accepted: Orders

## 2020-07-11 NOTE — Telephone Encounter (Signed)
Rx was called to pharm and I spoke with the pt and notified that this was done  Nothing further needed 

## 2020-07-12 ENCOUNTER — Telehealth: Payer: Self-pay | Admitting: Pulmonary Disease

## 2020-07-12 NOTE — Telephone Encounter (Signed)
[facility]oke with [facility] regarding prior message. Pharmacist stated that when script was called into the pharmacy was not given Amber Wall's  DEA #. Pharmacists voice was understanding. Nothing else further needed.

## 2020-09-04 ENCOUNTER — Telehealth: Payer: Self-pay | Admitting: Pulmonary Disease

## 2020-09-04 MED ORDER — PROMETHAZINE-CODEINE 6.25-10 MG/5ML PO SYRP: 5.0000 mL | ORAL_SOLUTION | Freq: Three times a day (TID) | ORAL | 0 refills | Status: DC | PRN

## 2020-09-04 NOTE — Telephone Encounter (Signed)
Routing to Dr. Francine Graven for advise as Dr. Vassie Loll has left for the day.  Please advise on request, thanks!

## 2020-09-04 NOTE — Telephone Encounter (Signed)
Pt returning a phone call. Pt can be reached at [phone].

## 2020-09-04 NOTE — Telephone Encounter (Signed)
Spoke with pt, aware of refill. I have pended the rx to preferred pharmacy, will route back to Dr. Francine Graven for signature.  Thanks!

## 2020-09-04 NOTE — Telephone Encounter (Signed)
Patient is returning phone call. Patient phone number is [phone]. 

## 2020-09-04 NOTE — Telephone Encounter (Signed)
LMTCB

## 2020-09-04 NOTE — Telephone Encounter (Signed)
Ok to refill her cough syrup prescription.  Thanks, Cletis Athens

## 2020-09-04 NOTE — Telephone Encounter (Signed)
lmtcb for pt.  

## 2020-09-04 NOTE — Telephone Encounter (Signed)
Spoke with the pt  She states her cough had resolved after last visit 07/04/20  It started back 2 wks ago- non prod and not having any increased SOB, wheezing, f/c/s or other symptoms  She is not vaccinated  Thinks she picked up a bug from her kids who are just going back to school  She is asking for refill on the phenergan with codeine syrup Her insurance will not fill more than #150 ml  Please advise, thanks!

## 2020-11-29 ENCOUNTER — Telehealth: Payer: Self-pay | Admitting: Pulmonary Disease

## 2020-11-29 NOTE — Telephone Encounter (Signed)
I have only seen her once for an acute cough in sep 2021 It seems like cough med Rx was refilled x1 Please let her know that covid is so widespread right now that we would advise testing Otherwise , OTC cough syrup such as delsym If cough persistent or worse, we can offer her a virtual visit

## 2020-11-29 NOTE — Telephone Encounter (Signed)
ATC patient.  LMTCB. 

## 2020-11-29 NOTE — Telephone Encounter (Signed)
Spoke with the pt  She is c/o increased cough- non prod for approx 10 days  She states it started right after she was outside in the cold for about an hour She states that for the past 2 days she has had a terrible sinus headache "sinus migraine" Also feels fatigued  No increased SOB, f/c/s, aches  She has NOT been vaccinated against covid  I suggested that she goes and gets tested and she disagreed, stating she had neg test 2 wks ago, which was before symptoms started (she was tested for screening purposes, not bc she was sick)   Please advise, thanks!

## 2020-11-29 NOTE — Telephone Encounter (Signed)
Spoke with patient. She verbalized understanding. She stated that she will go to an UC instead.   Nothing further needed at time of call.

## 2020-11-29 NOTE — Telephone Encounter (Signed)
Patient is returning phone call. Patient phone number is [phone].

## 2020-12-20 ENCOUNTER — Telehealth: Payer: Self-pay | Admitting: Pulmonary Disease

## 2020-12-20 MED ORDER — BENZONATATE 100 MG PO CAPS: 100.0000 mg | ORAL_CAPSULE | Freq: Three times a day (TID) | ORAL | 1 refills | Status: DC | PRN

## 2020-12-20 NOTE — Progress Notes (Deleted)
@Patient  ID: , female    DOB: December 06, 1980, 40 y.o.   MRN: [id number]  No chief complaint on file.   Referring provider: [id number], MD  HPI:  She reports childhood history of asthma but grew out of this is a 58-year-old   PMH: GERD, Obesity, Migraine, Insomnia, Hypersomnia, Anemia Smoker/ Smoking History: Never Maintenance:   Pt of: Dr. 8  12/20/2020  - Visit   40 y.o female followed in our clinic for Moderate persistent Asthma, Allergic rhinitis, Acute bronchitis   Last seen in our office 07/2020 for ongoing 1 month long cough. Recommendations from visit was treat as acute asthmatic bronchitis, not convinced that she has true persistent asthma, so does not need maintenance meds. MD felt it was  likely viral laryngo ,bronchitis. She was to complete Z-Pak, Finish Prednisone course, & Rx for Symbicort 80/4.5 - take 2 puffs twice daily as needed for wheezing, Albuterol MDI PRN.   Presents today states she has had a cough now x1 month. Pt states the cough is a dry cough. Pt states the cough is worse when lying down at night and also when she wakes up in the morning.   Pt is using her symbicort as prescribed but states that she has not used it in the last 3 days.  Pt has been using her rescue inhaler every 5-6 hours to help with her symptoms and also using nebulizer at least 1-2 times daily.  Pt has been using mucinex DM as well as the tessalon perles to see if that would help. Pt stated that the is almost out of the tessalon perles.  Pt had a come covid test performed 2/5 which was negative and then did a covid pcr 2/8 at her pharmacy which was also negative.  Plan: PFTs, Daily Inhaler,(ICS/LABA) Referral to GI, PPI in AM?, CXR, Labs- Abs. Eosin, ABX/pred?   Questionaires / Pulmonary Flowsheets:   ACT:  No flowsheet data found.  MMRC: No flowsheet data found.  Epworth:  No flowsheet data found.  Tests:   FENO:  No results found for:  NITRICOXIDE  PFT: No flowsheet data found.  WALK:  No flowsheet data found.  Imaging: No results found.  Lab Results:  CBC No results found for: WBC, RBC, HGB, HCT, PLT, MCV, MCH, MCHC, RDW, LYMPHSABS, MONOABS, EOSABS, BASOSABS  BMET No results found for: NA, K, CL, CO2, GLUCOSE, BUN, CREATININE, CALCIUM, GFRNONAA, GFRAA  BNP No results found for: BNP  ProBNP No results found for: PROBNP  Specialty Problems      Pulmonary Problems   RHINITIS, ALLERGIC    Qualifier: Diagnosis of  By: 08/2020        Moderate persistent asthma with acute exacerbation    Last Assessment & Plan:  Predominant symptom is cough but she also has tightness and wheezing.  She has a history of childhood asthma. Patient is instructed to use Advair every 12 hours, one inhalation. We will do another prednisone taper since that helped in the past. Patient to resume anti-histamine as she has persistent allergy symptoms, Flonase as well. Patient instructed to notify Haydee Salter if her symptoms do not resolve or significantly improved with these recommendations.      Asthma   Acute bronchitis      Allergies  Allergen Reactions   Amoxicillin Rash    Has patient had a PCN reaction causing immediate rash, facial/tongue/throat swelling, SOB or lightheadedness with hypotension: Yes Has patient had a PCN reaction causing severe rash  involving mucus membranes or skin necrosis: No Has patient had a PCN reaction that required hospitalization No Has patient had a PCN reaction occurring within the last 10 years: Yes If all of the above answers are "NO", then may proceed with Cephalosporin use.    Immunization History  Administered Date(s) Administered   Influenza Split 09/16/2007   Td 11/03/1996   Tdap 09/01/2014    Past Medical History:  Diagnosis Date   Anemia    Anxiety    Bronchitis    Esophageal stricture    GERD (gastroesophageal reflux disease)    Insomnia    Lactose  intolerance    Migraine    Sinus headache    Sleep apnea    not currently on CPAP machine     Tobacco History: Social History   Tobacco Use  Smoking Status Never Smoker  Smokeless Tobacco Never Used   Counseling given: Not Answered   Continue to not smoke  Outpatient Encounter Medications as of 12/21/2020  Medication Sig   albuterol (PROAIR HFA) 108 (90 Base) MCG/ACT inhaler Inhale 1-2 puffs into the lungs every 6 (six) hours as needed for wheezing or shortness of breath.   albuterol (PROVENTIL) (2.5 MG/3ML) 0.083% nebulizer solution Take 2.5 mg by nebulization every 6 (six) hours as needed for wheezing or shortness of breath.   azithromycin (ZITHROMAX) 250 MG tablet Take 2 tablets first day then 1 tablet daily until finished.   benzonatate (TESSALON) 100 MG capsule Take 1 capsule (100 mg total) by mouth 3 (three) times daily as needed for cough.   budesonide-formoterol (SYMBICORT) 80-4.5 MCG/ACT inhaler Inhale 2 puffs into the lungs daily.   diclofenac (FLECTOR) 1.3 % PTCH Place 1 patch onto the skin 2 (two) times daily.   etonogestrel-ethinyl estradiol (NUVARING) 0.12-0.015 MG/24HR vaginal ring Insert vaginally and leave in place for 3 consecutive weeks, then remove for 1 week.   ibuprofen (ADVIL,MOTRIN) 800 MG tablet Take 800 mg by mouth every 8 (eight) hours as needed.   nystatin-triamcinolone ointment (MYCOLOG) Apply 1 application topically 2 (two) times daily.   pantoprazole (PROTONIX) 40 MG tablet Take 80 mg by mouth daily.   phentermine (ADIPEX-P) 37.5 MG tablet Take 37.5 mg by mouth daily before breakfast.   predniSONE (STERAPRED UNI-PAK 21 TAB) 10 MG (21) TBPK tablet Take by mouth.   promethazine-codeine (PHENERGAN WITH CODEINE) 6.25-10 MG/5ML syrup Take 5 mLs by mouth every 8 (eight) hours as needed for cough.   ranitidine (ZANTAC) 150 MG tablet Take 2 tabs by mouth twice a day   sertraline (ZOLOFT) 50 MG tablet Take 1 tablet (50 mg total) by mouth  daily. Take 25 mg by mouth daily for the first 7 days, then switch to 50 mg daily.   sulfamethoxazole-trimethoprim (BACTRIM DS) 800-160 MG tablet Take 1 tablet by mouth 2 (two) times daily.   zolpidem (AMBIEN) 10 MG tablet Take 10 mg by mouth at bedtime as needed.   Facility-Administered Encounter Medications as of 12/21/2020  Medication   triamcinolone acetonide (KENALOG) 10 MG/ML injection 10 mg     Review of Systems  Review of Systems   Physical Exam  There were no vitals taken for this visit.  Wt Readings from Last 5 Encounters:  07/04/20 260 lb 6.4 oz (118.1 kg)  02/17/20 267 lb 6.4 oz (121.3 kg)  05/03/19 252 lb (114.3 kg)  11/30/18 250 lb (113.4 kg)  11/23/18 250 lb (113.4 kg)    BMI Readings from Last 5 Encounters:  07/04/20 49.20 kg/m  02/17/20 49.97 kg/m  05/03/19 47.61 kg/m  11/30/18 47.24 kg/m  11/23/18 46.10 kg/m     Physical Exam    Assessment & Plan:   No problem-specific Assessment & Plan notes found for this encounter.    No follow-ups on file.   Bethena Midget, RN 12/20/2020   This appointment required *** minutes of patient care (this includes precharting, chart review, review of results, face-to-face care, etc.).

## 2020-12-20 NOTE — Telephone Encounter (Signed)
Okay to refill Tessalon Perles 200 tid prn  #60 x 1 refill Please offer her OV with APP

## 2020-12-20 NOTE — Telephone Encounter (Signed)
Called and spoke with pt who states she has had a cough now x1 month. Pt states the cough is a dry cough. Pt states the cough is worse when lying down at night and also when she wakes up in the morning.  Pt states that she does have some pain/discomfort in her throat from coughing, no chest discomfort  Pt denies any complaints of wheezing, and also no fever as last temp was 98.7  Pt is using her symbicort as prescribed but states that she has not used it in the last 3 days.  Pt has been using her rescue inhaler every 5-6 hours to help with her symptoms and also using nebulizer at least 1-2 times daily.  Pt has been using mucinex DM as well as the tessalon perles to see if that would help. Pt stated that the is almost out of the tessalon perles.  Pt had a come covid test performed 2/5 which was negative and then did a covid pcr 2/8 at her pharmacy which was also negative.  Pt would like recommendations to help with her symptoms. Dr. Vassie Loll, please advise.

## 2020-12-20 NOTE — Telephone Encounter (Signed)
Called and spoke with pt letting her know that we were going to refill her tessalon and also stated to her that RA said for [place] to schedule an appt. Pt verbalized understanding and appt has been scheduled for pt tomorrow 2/18 with Arlys John. Nothing further needed.

## 2020-12-21 ENCOUNTER — Telehealth: Payer: Self-pay | Admitting: Pulmonary Disease

## 2020-12-21 ENCOUNTER — Ambulatory Visit (INDEPENDENT_AMBULATORY_CARE_PROVIDER_SITE_OTHER): Payer: Medicaid Other | Admitting: Pulmonary Disease

## 2020-12-21 ENCOUNTER — Ambulatory Visit: Payer: Medicaid Other | Admitting: Pulmonary Disease

## 2020-12-21 ENCOUNTER — Other Ambulatory Visit: Payer: Self-pay

## 2020-12-21 ENCOUNTER — Ambulatory Visit (INDEPENDENT_AMBULATORY_CARE_PROVIDER_SITE_OTHER): Payer: Medicaid Other

## 2020-12-21 ENCOUNTER — Encounter: Payer: Self-pay | Admitting: Pulmonary Disease

## 2020-12-21 VITALS — BP 118/78 | HR 85 | Temp 97.8°F | Ht 61.75 in | Wt 261.0 lb

## 2020-12-21 DIAGNOSIS — R059 Cough, unspecified: Secondary | ICD-10-CM

## 2020-12-21 DIAGNOSIS — J309 Allergic rhinitis, unspecified: Secondary | ICD-10-CM

## 2020-12-21 DIAGNOSIS — R1310 Dysphagia, unspecified: Secondary | ICD-10-CM | POA: Diagnosis not present

## 2020-12-21 DIAGNOSIS — J4520 Mild intermittent asthma, uncomplicated: Secondary | ICD-10-CM

## 2020-12-21 DIAGNOSIS — K219 Gastro-esophageal reflux disease without esophagitis: Secondary | ICD-10-CM

## 2020-12-21 MED ORDER — ALBUTEROL SULFATE (2.5 MG/3ML) 0.083% IN NEBU: 2.5000 mg | INHALATION_SOLUTION | Freq: Four times a day (QID) | RESPIRATORY_TRACT | 2 refills | Status: DC | PRN

## 2020-12-21 NOTE — Assessment & Plan Note (Signed)
Patient endorsing noncompliance with PPI Patient has not followed up with gastroenterology in almost 2 years Patient reporting worsening breakthrough reflux daily if not multiple times a day and worsen swallowing over the last 3 months Patient reporting nausea and vomiting episodes due to acid reflux  Plan: Lifestyle recommendations provided on paperwork for GERD Continue Protonix PPI Refer back to gastroenterology

## 2020-12-21 NOTE — Patient Instructions (Addendum)
You were seen today by Coral Ceo, NP  for:   1. Cough  - DG Chest 2 View; Future - IgE; Future - CBC with Differential/Platelet; Future - Pulmonary function test; Future  Do not believe need additional prednisone  Do not believe that you need antibiotics at this time  Chest x-ray today  Lab work today  We will order pulmonary function test to further evaluate your cough  Cough Home Instructions:  We believe you have a chronic/cyclical cough that is aggravated by reflux , coughing , and drainage.  . Goal is to not Cough or clear throat.  Marland Kitchen Avoid coughing or clearing throat by using:  o non-mint products/sugarless candy o Water o ice chips o Remember NO MINT PRODUCTS  . Medications to use:  o Mucinex DM 1-2 every 12 hrs or Delsym 2 tsp every 12 hrs for cough (These are Over the counter) o Tessalon Three times a day  As needed  Cough.  o Protonix 40 mg 15-26min she is just lost results before first meal of the day  o Pepcid 20mg  before dinner  o Zyrtec 10mg  at bedtime (Can use generic, this is over the counter) Sweet is sweetest can be     2. Gastroesophageal reflux disease, unspecified whether esophagitis present 3. Dysphagia, unspecified type  - Ambulatory referral to Gastroenterology  Protonix 40 mg tablet  >>>Please take 1 tablet daily 15 minutes to 30 minutes before your first meal of the day as well as before your other medications >>>Try to take at the same time each day >>>take this medication daily  GERD management: >>>Avoid laying flat until 2 hours after meals >>>Elevate head of the bed including entire chest >>>Reduce size of meals and amount of fat, acid, spices, caffeine and sweets >>>If you are smoking, Please stop! >>>Decrease alcohol consumption >>>Work on maintaining a healthy weight with normal BMI   4. Mild intermittent asthma, unspecified whether complicated 5. Allergic rhinitis, unspecified seasonality, unspecified trigger  We will order  a pulmonary function test to further evaluate your potential asthma  Chest x-ray today  Lab work today  We will request records from your allergist Dr.   We recommend today:  Orders Placed This Encounter  Procedures  . DG Chest 2 View    Standing Status:   Future    Number of Occurrences:   1    Standing Expiration Date:   04/20/2021    Order Specific Question:   Reason for Exam (SYMPTOM  OR DIAGNOSIS REQUIRED)    Answer:   Cough    Order Specific Question:   Preferred imaging location?    Answer:   Internal    Order Specific Question:   Radiology Contrast Protocol - do NOT remove file path    Answer:   [email or web address]  . IgE    Standing Status:   Future    Standing Expiration Date:   12/21/2021  . CBC with Differential/Platelet    Standing Status:   Future    Standing Expiration Date:   12/21/2021  . Ambulatory referral to Gastroenterology    Referral Priority:   Routine    Referral Type:   Consultation    Referral Reason:   Specialty Services Required    Number of Visits Requested:   1  . Pulmonary function test    Standing Status:   Future    Standing Expiration Date:   12/21/2021    Order Specific Question:   Where should this test  be performed?    Answer:   [facility]    Order Specific Question:   Full PFT: includes the following: basic spirometry, spirometry pre & post bronchodilator, diffusion capacity (DLCO), lung volumes    Answer:   Full PFT   Orders Placed This Encounter  Procedures  . DG Chest 2 View  . IgE  . CBC with Differential/Platelet  . Ambulatory referral to Gastroenterology  . Pulmonary function test   No orders of the defined types were placed in this encounter.   Follow Up:    Return in about 3 months (around 03/20/2021), or if symptoms worsen or fail to improve, for Follow up with Dr. Vassie Loll.   Notification of test results are managed in the following manner: If there are   any recommendations or changes to the  plan of care discussed in office today,  we will contact you and let you know what they are. If you do not hear from [place], then your results are normal and you can view them through your  MyChart account , or a letter will be sent to you. Thank you again for trusting [place] with your care  - Thank you, [facility]    It is flu season:   >>> Best ways to protect herself from the flu: Receive the yearly flu vaccine, practice good hand hygiene washing with soap and also using hand sanitizer when available, eat a nutritious meals, get adequate rest, hydrate appropriately       Please contact the office if your symptoms worsen or you have concerns that you are not improving.   Thank you for choosing [facility] for your healthcare, and for allowing [place] to partner with you on your healthcare journey. I am thankful to be able to provide care to you today.   Elisha Headland FNP-C    Gastroesophageal Reflux Disease, Adult  Gastroesophageal reflux (GER) happens when acid from the stomach flows up into the tube that connects the mouth and the stomach (esophagus). Normally, food travels down the esophagus and stays in the stomach to be digested. With GER, food and stomach acid sometimes move back up into the esophagus. You may have a disease called gastroesophageal reflux disease (GERD) if the reflux:  Happens often.  Causes frequent or very bad symptoms.  Causes problems such as damage to the esophagus. When this happens, the esophagus becomes sore and swollen. Over time, GERD can make small holes (ulcers) in the lining of the esophagus. What are the causes? This condition is caused by a problem with the muscle between the esophagus and the stomach. When this muscle is weak or not normal, it does not close properly to keep food and acid from coming back up from the stomach. The muscle can be weak because of:  Tobacco use.  Pregnancy.  Having a  certain type of hernia (hiatal hernia).  Alcohol use.  Certain foods and drinks, such as coffee, chocolate, onions, and peppermint. What increases the risk?  Being overweight.  Having a disease that affects your connective tissue.  Taking NSAIDs, such a ibuprofen. What are the signs or symptoms?  Heartburn.  Difficult or painful swallowing.  The feeling of having a lump in the throat.  A bitter taste in the mouth.  Bad breath.  Having a lot of saliva.  Having an upset or bloated stomach.  Burping.  Chest pain. Different conditions can cause chest pain. Make sure you see your doctor if you have chest pain.  Shortness of breath or wheezing.  A long-term cough or a cough at night.  Wearing away of the surface of teeth (tooth enamel).  Weight loss. How is this treated?  Making changes to your diet.  Taking medicine.  Having surgery. Treatment will depend on how bad your symptoms are. Follow these instructions at home: Eating and drinking  Follow a diet as told by your doctor. You may need to avoid foods and drinks such as: ? Coffee and tea, with or without caffeine. ? Drinks that contain alcohol. ? Energy drinks and sports drinks. ? Bubbly (carbonated) drinks or sodas. ? Chocolate and cocoa. ? Peppermint and mint flavorings. ? Garlic and onions. ? Horseradish. ? Spicy and acidic foods. These include peppers, chili powder, curry powder, vinegar, hot sauces, and BBQ sauce. ? Citrus fruit juices and citrus fruits, such as oranges, lemons, and limes. ? Tomato-based foods. These include red sauce, chili, salsa, and pizza with red sauce. ? Fried and fatty foods. These include donuts, french fries, potato chips, and high-fat dressings. ? High-fat meats. These include hot dogs, rib eye steak, sausage, ham, and bacon. ? High-fat dairy items, such as whole milk, butter, and cream cheese.  Eat small meals often. Avoid eating large meals.  Avoid drinking large  amounts of liquid with your meals.  Avoid eating meals during the 2-3 hours before bedtime.  Avoid lying down right after you eat.  Do not exercise right after you eat.   Lifestyle  Do not smoke or use any products that contain nicotine or tobacco. If you need help quitting, ask your doctor.  Try to lower your stress. If you need help doing this, ask your doctor.  If you are overweight, lose an amount of weight that is healthy for you. Ask your doctor about a safe weight loss goal.   General instructions  Pay attention to any changes in your symptoms.  Take over-the-counter and prescription medicines only as told by your doctor.  Do not take aspirin, ibuprofen, or other NSAIDs unless your doctor says it is okay.  Wear loose clothes. Do not wear anything tight around your waist.  Raise (elevate) the head of your bed about 6 inches (15 cm). You may need to use a wedge to do this.  Avoid bending over if this makes your symptoms worse.  Keep all follow-up visits. Contact a doctor if:  You have new symptoms.  You lose weight and you do not know why.  You have trouble swallowing or it hurts to swallow.  You have wheezing or a cough that keeps happening.  You have a hoarse voice.  Your symptoms do not get better with treatment. Get help right away if:  You have sudden pain in your arms, neck, jaw, teeth, or back.  You suddenly feel sweaty, dizzy, or light-headed.  You have chest pain or shortness of breath.  You vomit and the vomit is green, yellow, or black, or it looks like blood or coffee grounds.  You faint.  Your poop (stool) is red, bloody, or black.  You cannot swallow, drink, or eat. These symptoms may represent a serious problem that is an emergency. Do not wait to see if the symptoms will go away. Get medical help right away. Call your local emergency services (911 in the [place] Do not drive yourself to the hospital. Summary  If a person has  gastroesophageal reflux disease (GERD), food and stomach acid move back up into the esophagus and cause symptoms or  problems such as damage to the esophagus.  Treatment will depend on how bad your symptoms are.  Follow a diet as told by your doctor.  Take all medicines only as told by your doctor. This information is not intended to replace advice given to you by your health care provider. Make sure you discuss any questions you have with your health care provider. Document Revised: 04/30/2020 Document Reviewed: 04/30/2020 Elsevier Patient Education  2021 [employer]   Food Choices for Gastroesophageal Reflux Disease, Adult When you have gastroesophageal reflux disease (GERD), the foods you eat and your eating habits are very important. Choosing the right foods can help ease your discomfort. Think about working with a food expert (dietitian) to help you make good choices. What are tips for following this plan? Reading food labels  Look for foods that are low in saturated fat. Foods that may help with your symptoms include: ? Foods that have less than 5% of daily value (DV) of fat. ? Foods that have 0 grams of trans fat. Cooking  Do not fry your food.  Cook your food by baking, steaming, grilling, or broiling. These are all methods that do not need a lot of fat for cooking.  To add flavor, try to use herbs that are low in spice and acidity. Meal planning  Choose healthy foods that are low in fat, such as: ? Fruits and vegetables. ? Whole grains. ? Low-fat dairy products. ? Lean meats, fish, and poultry.  Eat small meals often instead of eating 3 large meals each day. Eat your meals slowly in a place where you are relaxed. Avoid bending over or lying down until 2-3 hours after eating.  Limit high-fat foods such as fatty meats or fried foods.  Limit your intake of fatty foods, such as oils, butter, and shortening.  Avoid the following as told by your doctor: ? Foods that  cause symptoms. These may be different for different people. Keep a food diary to keep track of foods that cause symptoms. ? Alcohol. ? Drinking a lot of liquid with meals. ? Eating meals during the 2-3 hours before bed.   Lifestyle  Stay at a healthy weight. Ask your doctor what weight is healthy for you. If you need to lose weight, work with your doctor to do so safely.  Exercise for at least 30 minutes on 5 or more days each week, or as told by your doctor.  Wear loose-fitting clothes.  Do not smoke or use any products that contain nicotine or tobacco. If you need help quitting, ask your doctor.  Sleep with the head of your bed higher than your feet. Use a wedge under the mattress or blocks under the bed frame to raise the head of the bed.  Chew sugar-free gum after meals. What foods should eat? Eat a healthy, well-balanced diet of fruits, vegetables, whole grains, low-fat dairy products, lean meats, fish, and poultry. Each person is different. Foods that may cause symptoms in one person may not cause any symptoms in another person. Work with your doctor to find foods that are safe for you. The items listed above may not be a complete list of what you can eat and drink. Contact a food expert for more options.   What foods should I avoid? Limiting some of these foods may help in managing the symptoms of GERD. Everyone is different. Talk with a food expert or your doctor to help you find the exact foods to avoid, if any.  Fruits Any fruits prepared with added fat. Any fruits that cause symptoms. For some people, this may include citrus fruits, such as oranges, grapefruit, pineapple, and lemons. Vegetables Deep-fried vegetables. [place] fries. Any vegetables prepared with added fat. Any vegetables that cause symptoms. For some people, this may include tomatoes and tomato products, chili peppers, onions and garlic, and horseradish. Grains Pastries or quick breads with added fat. Meats and  other proteins High-fat meats, such as fatty beef or pork, hot dogs, ribs, ham, sausage, salami, and bacon. Fried meat or protein, including fried fish and fried chicken. Nuts and nut butters, in large amounts. Dairy Whole milk and chocolate milk. Sour cream. Cream. Ice cream. Cream cheese. Milkshakes. Fats and oils Butter. Margarine. Shortening. Ghee. Beverages Coffee and tea, with or without caffeine. Carbonated beverages. Sodas. Energy drinks. Fruit juice made with acidic fruits, such as orange or grapefruit. Tomato juice. Alcoholic drinks. Sweets and desserts Chocolate and cocoa. Donuts. Seasonings and condiments Pepper. Peppermint and spearmint. Added salt. Any condiments, herbs, or seasonings that cause symptoms. For some people, this may include curry, hot sauce, or vinegar-based salad dressings. The items listed above may not be a complete list of what you should not eat and drink. Contact a food expert for more options. Questions to ask your doctor Diet and lifestyle changes are often the first steps that are taken to manage symptoms of GERD. If diet and lifestyle changes do not help, talk with your doctor about taking medicines. Where to find more information  International [employer] for Gastrointestinal Disorders: aboutgerd.org Summary  When you have GERD, food and lifestyle choices are very important in easing your symptoms.  Eat small meals often instead of 3 large meals a day. Eat your meals slowly and in a place where you are relaxed.  Avoid bending over or lying down until 2-3 hours after eating.  Limit high-fat foods such as fatty meats or fried foods. This information is not intended to replace advice given to you by your health care provider. Make sure you discuss any questions you have with your health care provider. Document Revised: 04/30/2020 Document Reviewed: 04/30/2020 Elsevier Patient Education  2021 [employer].

## 2020-12-21 NOTE — Assessment & Plan Note (Signed)
Plan: Pulmonary function testing ordered Continue Symbicort 80 as needed use Lab work today We will request records from allergy asthma

## 2020-12-21 NOTE — Assessment & Plan Note (Signed)
Plan: Request records from allergy asthma Dr. Carrolyn Meiers office Lab work today

## 2020-12-21 NOTE — Assessment & Plan Note (Signed)
Plan: Refer back to gastroenterology for eval

## 2020-12-21 NOTE — Telephone Encounter (Signed)
Spoke with patient. She stated that she was seen today by Arlys John wanted to know where she could get her nebulizer machine from. I explained to her that once the order has been placed our PCCs will find a DME company in network with her insurance company and that company will then reach out to her for final arrangements. She was under the impression that the RX would be sent to a local pharmacy. I advised her that some pharmacies do have machines in stock.   She wants to call a few local pharmacies and call us back if she can find one that has them. Will leave this encounter open for follow up.

## 2020-12-21 NOTE — Assessment & Plan Note (Signed)
Patient's cough is likely multifactorial.  I do not believe that she has a significant case of asthma.  We will continue the work-up with a pulmonary function test as well as lab work today as well as a chest x-ray.  I believe patient's most pertinent issue is her acid reflux as well as her dysphagia.  Believe that the breakthrough daily reflux is likely the cause of her dry cyclical cough.  Given the fact that the cough is dry and the lungs are clear on exam today we will not administer another course of prednisone.  We will not prescribe antibiotics at this time.  If the cough becomes productive can consider sputum cultures.  Plan: Chest x-ray today Lab work today Refer back to gastroenterology Pulmonary function testing today Okay to continue Symbicort 80 as needed use

## 2020-12-21 NOTE — Progress Notes (Signed)
@Patient  ID: , female    DOB: 03/18/1981, 40 y.o.   MRN: [id number]  Chief Complaint  Patient presents with  . Acute Visit    Dry cough     Referring provider: [id number], MD  HPI:  40 year old female never smoker [facility] for cough, history of childhood asthma  PMH: GERD, obesity, insomnia Smoker/ Smoking History: Never smoker Maintenance: Symbicort 80 (as needed use) Pt of: Dr. 20   12/21/2020  - Visit   41 year old female never smoker presenting to office today for an acute visit for worsening cough.  Patient was seen in September/2021 for a pulmonary consult for suspected asthma.  Patient is established with Dr. October/2021.  Patient reports a cyclical cough that has worsened over the last month.  It is a dry cough.  She is already been treated with prednisone.  She feels that prednisone does help.  She also has persistent daily breakthrough reflux.  She has a history of multiple esophageal stretching's.  Last evaluated by gastroenterology in January/2020.  Patient admitting today she is not taking a PPI regularly.  We will discuss it.  In September/2021 she was prescribed Symbicort 80 to be used 2 puffs daily as needed for cough and wheezing based off the [place] guidelines.  Patient reports she had to use this yesterday.  She is not taking it regularly.  Patient reports that she has previously been established with allergy asthma Dr. Pemberton.  We do not have these records.  We will request these today  Patient feels that she may benefit from another course of prednisone.  We will discuss this today.  She last finished prednisone around 12/08/2020.  Patient also reports that her dysphagia has worsened over the last 3 months.  She is having trouble swallowing again.  She feels that she may need an esophageal stretching.  Questionaires / Pulmonary Flowsheets:   ACT:  No flowsheet data found.  MMRC: No flowsheet data found.  Epworth:  No flowsheet data  found.  Tests:   FENO:  No results found for: NITRICOXIDE  PFT: No flowsheet data found.  WALK:  No flowsheet data found.  Imaging: No results found.  Lab Results:  CBC No results found for: WBC, RBC, HGB, HCT, PLT, MCV, MCH, MCHC, RDW, LYMPHSABS, MONOABS, EOSABS, BASOSABS  BMET No results found for: NA, K, CL, CO2, GLUCOSE, BUN, CREATININE, CALCIUM, GFRNONAA, GFRAA  BNP No results found for: BNP  ProBNP No results found for: PROBNP  Specialty Problems      Pulmonary Problems   Allergic rhinitis    Qualifier: Diagnosis of  By: 02/05/2021        Moderate persistent asthma with acute exacerbation    Last Assessment & Plan:  Predominant symptom is cough but she also has tightness and wheezing.  She has a history of childhood asthma. Patient is instructed to use Advair every 12 hours, one inhalation. We will do another prednisone taper since that helped in the past. Patient to resume anti-histamine as she has persistent allergy symptoms, Flonase as well. Patient instructed to notify Haydee Salter if her symptoms do not resolve or significantly improved with these recommendations.      Asthma   Acute bronchitis   Cough      Allergies  Allergen Reactions  . Amoxicillin Rash    Has patient had a PCN reaction causing immediate rash, facial/tongue/throat swelling, SOB or lightheadedness with hypotension: Yes Has patient had a PCN reaction causing severe rash involving  mucus membranes or skin necrosis: No Has patient had a PCN reaction that required hospitalization No Has patient had a PCN reaction occurring within the last 10 years: Yes If all of the above answers are "NO", then may proceed with Cephalosporin use.    Immunization History  Administered Date(s) Administered  . Influenza Split 09/16/2007  . Td 11/03/1996  . Tdap 09/01/2014    Past Medical History:  Diagnosis Date  . Anemia   . Anxiety   . Bronchitis   . Esophageal stricture   . GERD  (gastroesophageal reflux disease)   . Insomnia   . Lactose intolerance   . Migraine   . Sinus headache   . Sleep apnea    not currently on CPAP machine     Tobacco History: Social History   Tobacco Use  Smoking Status Never Smoker  Smokeless Tobacco Never Used   Counseling given: Not Answered   Continue to not smoke  Outpatient Encounter Medications as of 12/21/2020  Medication Sig  . albuterol (PROAIR HFA) 108 (90 Base) MCG/ACT inhaler Inhale 1-2 puffs into the lungs every 6 (six) hours as needed for wheezing or shortness of breath.  Marland Kitchen azithromycin (ZITHROMAX) 250 MG tablet Take 2 tablets first day then 1 tablet daily until finished.  . benzonatate (TESSALON) 100 MG capsule Take 1 capsule (100 mg total) by mouth 3 (three) times daily as needed for cough.  . diclofenac (FLECTOR) 1.3 % PTCH Place 1 patch onto the skin 2 (two) times daily.  Marland Kitchen etonogestrel-ethinyl estradiol (NUVARING) 0.12-0.015 MG/24HR vaginal ring Insert vaginally and leave in place for 3 consecutive weeks, then remove for 1 week.  Marland Kitchen ibuprofen (ADVIL,MOTRIN) 800 MG tablet Take 800 mg by mouth every 8 (eight) hours as needed.  . nystatin-triamcinolone ointment (MYCOLOG) Apply 1 application topically 2 (two) times daily.  . pantoprazole (PROTONIX) 40 MG tablet Take 80 mg by mouth daily.  . phentermine (ADIPEX-P) 37.5 MG tablet Take 37.5 mg by mouth daily before breakfast.  . predniSONE (STERAPRED UNI-PAK 21 TAB) 10 MG (21) TBPK tablet Take by mouth.  . promethazine-codeine (PHENERGAN WITH CODEINE) 6.25-10 MG/5ML syrup Take 5 mLs by mouth every 8 (eight) hours as needed for cough.  . sertraline (ZOLOFT) 50 MG tablet Take 1 tablet (50 mg total) by mouth daily. Take 25 mg by mouth daily for the first 7 days, then switch to 50 mg daily.  Marland Kitchen sulfamethoxazole-trimethoprim (BACTRIM DS) 800-160 MG tablet Take 1 tablet by mouth 2 (two) times daily.  Marland Kitchen zolpidem (AMBIEN) 10 MG tablet Take 10 mg by mouth at bedtime as needed.  .  [DISCONTINUED] albuterol (PROVENTIL) (2.5 MG/3ML) 0.083% nebulizer solution Take 2.5 mg by nebulization every 6 (six) hours as needed for wheezing or shortness of breath.  Marland Kitchen albuterol (PROVENTIL) (2.5 MG/3ML) 0.083% nebulizer solution Take 3 mLs (2.5 mg total) by nebulization every 6 (six) hours as needed for wheezing or shortness of breath.  . budesonide-formoterol (SYMBICORT) 80-4.5 MCG/ACT inhaler Inhale 2 puffs into the lungs daily. (Patient not taking: Reported on 12/21/2020)  . ranitidine (ZANTAC) 150 MG tablet Take 2 tabs by mouth twice a day (Patient not taking: Reported on 12/21/2020)   Facility-Administered Encounter Medications as of 12/21/2020  Medication  . triamcinolone acetonide (KENALOG) 10 MG/ML injection 10 mg     Review of Systems  Review of Systems  Constitutional: Negative for activity change, fatigue and fever.  HENT: Positive for trouble swallowing. Negative for sinus pressure, sinus pain, sore throat and voice change.  Respiratory: Positive for cough. Negative for shortness of breath and wheezing.   Cardiovascular: Negative for chest pain and palpitations.  Gastrointestinal: Negative for diarrhea, nausea and vomiting.       +GERD   Musculoskeletal: Negative for arthralgias.  Neurological: Negative for dizziness.  Psychiatric/Behavioral: Negative for sleep disturbance. The patient is not nervous/anxious.      Physical Exam  BP 118/78 (BP Location: Left Arm, Cuff Size: Large)   Pulse 85   Temp 97.8 F (36.6 C) (Temporal)   Ht 5' 1.75" (1.568 m)   Wt 261 lb (118.4 kg)   SpO2 98%   BMI 48.12 kg/m   Wt Readings from Last 5 Encounters:  12/21/20 261 lb (118.4 kg)  07/04/20 260 lb 6.4 oz (118.1 kg)  02/17/20 267 lb 6.4 oz (121.3 kg)  05/03/19 252 lb (114.3 kg)  11/30/18 250 lb (113.4 kg)    BMI Readings from Last 5 Encounters:  12/21/20 48.12 kg/m  07/04/20 49.20 kg/m  02/17/20 49.97 kg/m  05/03/19 47.61 kg/m  11/30/18 47.24 kg/m     Physical  Exam Vitals and nursing note reviewed.  Constitutional:      General: She is not in acute distress.    Appearance: Normal appearance. She is obese.  HENT:     Head: Normocephalic and atraumatic.     Right Ear: Tympanic membrane, ear canal and external ear normal. There is no impacted cerumen.     Left Ear: Tympanic membrane, ear canal and external ear normal. There is no impacted cerumen.     Nose: Nose normal. No congestion or rhinorrhea.     Mouth/Throat:     Mouth: Mucous membranes are dry.     Pharynx: Oropharynx is clear. No posterior oropharyngeal erythema.  Eyes:     Pupils: Pupils are equal, round, and reactive to light.  Cardiovascular:     Rate and Rhythm: Normal rate and regular rhythm.     Pulses: Normal pulses.     Heart sounds: Normal heart sounds. No murmur heard.   Pulmonary:     Effort: Pulmonary effort is normal. No respiratory distress.     Breath sounds: Normal breath sounds. No decreased air movement. No decreased breath sounds, wheezing or rales.     Comments: Coughing episodes during exam  Abdominal:     General: Abdomen is flat. Bowel sounds are normal. There is no distension.     Palpations: Abdomen is soft. There is no mass.     Hernia: A hernia (hx of hiatial hernia ) is present.  Musculoskeletal:     Cervical back: Normal range of motion.  Skin:    General: Skin is warm and dry.     Capillary Refill: Capillary refill takes less than 2 seconds.  Neurological:     General: No focal deficit present.     Mental Status: She is alert and oriented to person, place, and time. Mental status is at baseline.     Gait: Gait normal.  Psychiatric:        Mood and Affect: Mood normal.        Behavior: Behavior normal.        Thought Content: Thought content normal.        Judgment: Judgment normal.       Assessment & Plan:   Allergic rhinitis Plan: Request records from allergy asthma Dr. Carrolyn MeiersVanwinkle's office Lab work today  Asthma Plan: Pulmonary  function testing ordered Continue Symbicort 80 as needed use Lab work today We  will request records from allergy asthma  Dysphagia Plan: Refer back to gastroenterology for eval  GASTROESOPHAGEAL REFLUX, NO ESOPHAGITIS Patient endorsing noncompliance with PPI Patient has not followed up with gastroenterology in almost 2 years Patient reporting worsening breakthrough reflux daily if not multiple times a day and worsen swallowing over the last 3 months Patient reporting nausea and vomiting episodes due to acid reflux  Plan: Lifestyle recommendations provided on paperwork for GERD Continue Protonix PPI Refer back to gastroenterology  Cough Patient's cough is likely multifactorial.  I do not believe that she has a significant case of asthma.  We will continue the work-up with a pulmonary function test as well as lab work today as well as a chest x-ray.  I believe patient's most pertinent issue is her acid reflux as well as her dysphagia.  Believe that the breakthrough daily reflux is likely the cause of her dry cyclical cough.  Given the fact that the cough is dry and the lungs are clear on exam today we will not administer another course of prednisone.  We will not prescribe antibiotics at this time.  If the cough becomes productive can consider sputum cultures.  Plan: Chest x-ray today Lab work today Refer back to gastroenterology Pulmonary function testing today Okay to continue Symbicort 80 as needed use    Return in about 3 months (around 03/20/2021), or if symptoms worsen or fail to improve, for Follow up with Dr. Vassie Loll.   Coral Ceo, NP 12/21/2020   This appointment required 34 minutes of patient care (this includes precharting, chart review, review of results, face-to-face care, etc.).

## 2020-12-24 ENCOUNTER — Other Ambulatory Visit: Payer: Self-pay | Admitting: Pulmonary Disease

## 2020-12-24 DIAGNOSIS — J4520 Mild intermittent asthma, uncomplicated: Secondary | ICD-10-CM

## 2020-12-24 NOTE — Telephone Encounter (Signed)
LMTCB

## 2020-12-24 NOTE — Telephone Encounter (Signed)
Dr Vassie Loll, please advise on email from pt after she reviewed these cxr results:  No acute pulmonary findings found on chest x-ray. This is reassuring. No additional antibiotics or prednisone is needed at this time. Still showing known hiatal hernia. Is extremely important patient is established with gastroenterology as a follow-up as quickly as possible for evaluation of acid reflux.  Elisha Headland, FNP        I made an appointment at [facility] for February 25 thank you so much. The hiatal hernia, is it the same thing I had before? Did it go away and come back is that how it works?

## 2020-12-25 NOTE — Telephone Encounter (Signed)
Called and spoke to pt. Pt states she has not asked around to local pharmacies yet. Pt questioning how long it would take to get from a DME company and does it get delivered to her house.    PCC's do you know how long it takes for a pt to get a neb machine and does it get delivered to their house?

## 2020-12-26 NOTE — Telephone Encounter (Signed)
Order was placed on 2/18 for nebulizer and was sent to [facility].  I called [facility] customer service and spoke to Amber.  She states nebulizer is to be shipped out today.  I called pt & made her aware.  Nothing further needed.

## 2020-12-28 ENCOUNTER — Ambulatory Visit: Payer: Medicaid Other | Admitting: Physician Assistant

## 2021-01-01 ENCOUNTER — Ambulatory Visit: Payer: Medicaid Other | Admitting: Gastroenterology

## 2021-01-01 ENCOUNTER — Other Ambulatory Visit: Payer: Self-pay | Admitting: Internal Medicine

## 2021-01-01 DIAGNOSIS — N6310 Unspecified lump in the right breast, unspecified quadrant: Secondary | ICD-10-CM

## 2021-01-17 ENCOUNTER — Ambulatory Visit: Payer: Medicaid Other | Admitting: Physician Assistant

## 2021-01-31 ENCOUNTER — Ambulatory Visit (INDEPENDENT_AMBULATORY_CARE_PROVIDER_SITE_OTHER): Payer: Medicaid Other | Admitting: Physician Assistant

## 2021-01-31 ENCOUNTER — Encounter: Payer: Self-pay | Admitting: Physician Assistant

## 2021-01-31 VITALS — BP 116/82 | HR 75 | Ht 61.75 in | Wt 256.5 lb

## 2021-01-31 DIAGNOSIS — K219 Gastro-esophageal reflux disease without esophagitis: Secondary | ICD-10-CM

## 2021-01-31 DIAGNOSIS — R1310 Dysphagia, unspecified: Secondary | ICD-10-CM | POA: Diagnosis not present

## 2021-01-31 MED ORDER — PANTOPRAZOLE SODIUM 40 MG PO TBEC: 40.0000 mg | DELAYED_RELEASE_TABLET | Freq: Every day | ORAL | 4 refills | Status: DC

## 2021-01-31 NOTE — Patient Instructions (Signed)
If you are age 39 or older, your body mass index should be between 23-30. Your Body mass index is 47.3 kg/m. If this is out of the aforementioned range listed, please consider follow up with your Primary Care Provider.  If you are age 106 or younger, your body mass index should be between 19-25. Your Body mass index is 47.3 kg/m. If this is out of the aformentioned range listed, please consider follow up with your Primary Care Provider.   You have been scheduled for an endoscopy. Please follow written instructions given to you at your visit today. If you use inhalers (even only as needed), please bring them with you on the day of your procedure.  Continue Pantoprazole 40 mg 1 tablet before breakfast.  Follow an anti-reflux diet.  Follow up pending Endoscopy or as needed.  Thank you for entrusting me with your care and choosing [facility].  Amy Esterwood, PA-C

## 2021-01-31 NOTE — Progress Notes (Signed)
Agree with the assessment and plan as outlined by Amy Esterwood, PA-C.  Kendyl Festa, DO, FACG  

## 2021-01-31 NOTE — Progress Notes (Signed)
Subjective:    Patient ID: Amber Wall, female    DOB: Apr 11, 1981, 40 y.o.   MRN: [id number]  HPI Amber Wall is a pleasant 40 year old African-American female, established with Dr. Barron Alvine who comes in today with complaints of recurrent dysphagia and frequent episodes requiring regurgitation. Patient has history of chronic GERD, migraines, asthma, and obesity. She had undergone upper endoscopy in January 2020 and was found to have a 3 cm hiatal hernia, open GE flap and a distal esophageal stricture which was TTS dilated from 13.5 to 15.5 mm and then balloon dilated to 19 mm.  Also had grade a esophagitis, biopsies were taken of the stomach and negative for H. pylori. Patient says that the dilation definitely helped her symptoms however she has had recurrence probably over the past several months to a year with gradual progression.  She does not have any dysphagia to liquids but says almost everything that she eats is causing problems again.  Most meals and most solid foods will be associated with a sense of food sticking in the esophagus.  She says things used to eventually go on down now she is having more frequent episodes requiring regurgitation.  She is also having nocturnal reflux. Patient says she is not great about remembering to take medications and has not been taking the Protonix regularly.  She is not sure whether she was supposed to be taking it once daily or twice daily.    Review of Systems Pertinent positive and negative review of systems were noted in the above HPI section.  All other review of systems was otherwise negative.  Outpatient Encounter Medications as of 01/31/2021  Medication Sig  . albuterol (PROAIR HFA) 108 (90 Base) MCG/ACT inhaler Inhale 1-2 puffs into the lungs every 6 (six) hours as needed for wheezing or shortness of breath.  Marland Kitchen albuterol (PROVENTIL) (2.5 MG/3ML) 0.083% nebulizer solution Take 3 mLs (2.5 mg total) by nebulization every 6 (six) hours as needed for  wheezing or shortness of breath.  . benzonatate (TESSALON) 100 MG capsule Take 1 capsule (100 mg total) by mouth 3 (three) times daily as needed for cough.  . budesonide-formoterol (SYMBICORT) 80-4.5 MCG/ACT inhaler Inhale 2 puffs into the lungs daily.  Marland Kitchen etonogestrel-ethinyl estradiol (NUVARING) 0.12-0.015 MG/24HR vaginal ring Insert vaginally and leave in place for 3 consecutive weeks, then remove for 1 week.  . nystatin-triamcinolone ointment (MYCOLOG) Apply 1 application topically 2 (two) times daily.  . sertraline (ZOLOFT) 50 MG tablet Take 1 tablet (50 mg total) by mouth daily. Take 25 mg by mouth daily for the first 7 days, then switch to 50 mg daily.  Marland Kitchen zolpidem (AMBIEN) 10 MG tablet Take 10 mg by mouth at bedtime as needed.  . [DISCONTINUED] azithromycin (ZITHROMAX) 250 MG tablet Take 2 tablets first day then 1 tablet daily until finished.  . [DISCONTINUED] diclofenac (FLECTOR) 1.3 % PTCH Place 1 patch onto the skin 2 (two) times daily.  . [DISCONTINUED] ibuprofen (ADVIL,MOTRIN) 800 MG tablet Take 800 mg by mouth every 8 (eight) hours as needed.  . [DISCONTINUED] pantoprazole (PROTONIX) 40 MG tablet Take 40 mg by mouth daily.  . [DISCONTINUED] phentermine (ADIPEX-P) 37.5 MG tablet Take 37.5 mg by mouth daily before breakfast.  . [DISCONTINUED] predniSONE (STERAPRED UNI-PAK 21 TAB) 10 MG (21) TBPK tablet Take by mouth.  . [DISCONTINUED] promethazine-codeine (PHENERGAN WITH CODEINE) 6.25-10 MG/5ML syrup Take 5 mLs by mouth every 8 (eight) hours as needed for cough.  . [DISCONTINUED] ranitidine (ZANTAC) 150 MG tablet   . [  DISCONTINUED] sulfamethoxazole-trimethoprim (BACTRIM DS) 800-160 MG tablet Take 1 tablet by mouth 2 (two) times daily.  . pantoprazole (PROTONIX) 40 MG tablet Take 1 tablet (40 mg total) by mouth daily.   Facility-Administered Encounter Medications as of 01/31/2021  Medication  . triamcinolone acetonide (KENALOG) 10 MG/ML injection 10 mg   Allergies  Allergen Reactions   . Amoxicillin Rash    Has patient had a PCN reaction causing immediate rash, facial/tongue/throat swelling, SOB or lightheadedness with hypotension: Yes Has patient had a PCN reaction causing severe rash involving mucus membranes or skin necrosis: No Has patient had a PCN reaction that required hospitalization No Has patient had a PCN reaction occurring within the last 10 years: Yes If all of the above answers are "NO", then may proceed with Cephalosporin use.   Patient Active Problem List   Diagnosis Date Noted  . Dysphagia 12/21/2020  . Cough 12/21/2020  . Acute bronchitis 07/04/2020  . Insomnia 06/30/2016  . Hypersomnia 06/30/2016  . Asthma 06/30/2016  . Vaginal delivery 01/03/2016  . Moderate persistent asthma with acute exacerbation 11/17/2014  . Pain in soft tissues of limb 02/22/2014  . Problems influencing health status 01/04/2014  . Hemorrhoids 11/24/2013  . Ganglion 07/30/2013  . Anemia 04/04/2013  . Carpal tunnel syndrome 04/04/2013  . OBESITY, NOS 12/31/2006  . MIGRAINE, UNSPEC., W/O INTRACTABLE MIGRAINE 12/31/2006  . Allergic rhinitis 12/31/2006  . GASTROESOPHAGEAL REFLUX, NO ESOPHAGITIS 12/31/2006   Social History   Socioeconomic History  . Marital status: Single    Spouse name: Not on file  . Number of children: 2  . Years of education: Not on file  . Highest education level: Not on file  Occupational History  . Occupation: hospitality   Tobacco Use  . Smoking status: Never Smoker  . Smokeless tobacco: Never Used  Vaping Use  . Vaping Use: Never used  Substance and Sexual Activity  . Alcohol use: No  . Drug use: No  . Sexual activity: Yes    Partners: Male    Birth control/protection: None  Other Topics Concern  . Not on file  Social History Narrative  . Not on file   Social Determinants of Health   Financial Resource Strain: Not on file  Food Insecurity: Not on file  Transportation Needs: Not on file  Physical Activity: Not on file   Stress: Not on file  Social Connections: Not on file  Intimate Partner Violence: Not on file    Amber Wall's family history includes Aneurysm in her maternal grandmother; Colon polyps in her mother; Dementia in her maternal grandfather; Diabetes in her father; Diverticulitis in her mother; Heart disease in her father and maternal grandfather; Irritable bowel syndrome in her mother; Lactose intolerance in her mother; Migraines in her mother.      Objective:    Vitals:   01/31/21 1403  BP: 116/82  Pulse: 75    Physical Exam Well-developed well-nourished AA female in no acute distress.  Height, Weight,256  BMI 47.3  HEENT; nontraumatic normocephalic, EOMI, PE R LA, sclera anicteric. Oropharynx;not done Neck; supple, no JVD Cardiovascular; regular rate and rhythm with S1-S2, no murmur rub or gallop Pulmonary; Clear bilaterally Abdomen; soft obese, , nontender, nondistended, no palpable mass or hepatosplenomegaly, bowel sounds are active Rectal;not done Skin; benign exam, no jaundice rash or appreciable lesions Extremities; no clubbing cyanosis or edema skin warm and dry Neuro/Psych; alert and oriented x4, grossly nonfocal mood and affect appropriate       Assessment & Plan:   #  22 40 year old white female with history of chronic GERD and prior history of distal esophageal stricture requiring dilation in January 2020, now with recurrent solid food dysphagia over the past several months and frequent episodes requiring regurgitation. She continues to have reflux symptoms as well particularly at night.  Symptoms are consistent with recurrent esophageal stricture and poorly controlled chronic GERD.  #2 morbid obesity-BMI 47 3.  Migraine headaches 4.  Asthma  Plan; Start strict antireflux regimen, educational materials provided we discussed n.p.o. before bedtime for 2 to 3 hours and elevation of the back 45 degrees for sleep Antireflux diet. We discussed weight loss, which will be  helpful for her chronic GERD symptoms As she has not been taking the Protonix on a very regular basis will restart 40 mg 1 p.o. every morning to start and strongly advised to take this on a daily basis. Patient will be scheduled for EGD with probable esophageal dilation with Dr. Barron Alvine.  Procedure was discussed in detail with the patient including indications risk and benefits and she is agreeable to proceed.  Amber Cali Oswald Hillock PA-C 01/31/2021   Cc: Quitman Livings, MD

## 2021-02-20 ENCOUNTER — Encounter: Payer: Self-pay | Admitting: Gastroenterology

## 2021-02-20 ENCOUNTER — Other Ambulatory Visit: Payer: Self-pay

## 2021-02-20 ENCOUNTER — Ambulatory Visit (AMBULATORY_SURGERY_CENTER): Payer: Medicaid Other | Admitting: Gastroenterology

## 2021-02-20 VITALS — BP 117/75 | HR 70 | Temp 97.9°F | Resp 16 | Ht 61.0 in | Wt 256.0 lb

## 2021-02-20 DIAGNOSIS — K2990 Gastroduodenitis, unspecified, without bleeding: Secondary | ICD-10-CM | POA: Diagnosis not present

## 2021-02-20 DIAGNOSIS — K2970 Gastritis, unspecified, without bleeding: Secondary | ICD-10-CM | POA: Diagnosis not present

## 2021-02-20 DIAGNOSIS — K219 Gastro-esophageal reflux disease without esophagitis: Secondary | ICD-10-CM | POA: Diagnosis not present

## 2021-02-20 DIAGNOSIS — K222 Esophageal obstruction: Secondary | ICD-10-CM

## 2021-02-20 DIAGNOSIS — K3189 Other diseases of stomach and duodenum: Secondary | ICD-10-CM | POA: Diagnosis not present

## 2021-02-20 DIAGNOSIS — R1310 Dysphagia, unspecified: Secondary | ICD-10-CM | POA: Diagnosis not present

## 2021-02-20 DIAGNOSIS — K449 Diaphragmatic hernia without obstruction or gangrene: Secondary | ICD-10-CM

## 2021-02-20 MED ORDER — SODIUM CHLORIDE 0.9 % IV SOLN: 500.0000 mL | Freq: Once | INTRAVENOUS | Status: DC

## 2021-02-20 NOTE — Progress Notes (Signed)
A/ox3, pleased with MAC, report to RN 

## 2021-02-20 NOTE — Op Note (Signed)
[facility] Patient Name: Amber ChannelDwan Buys Procedure Date: 02/20/2021 10:45 AM MRN: [id number] Endoscopist: Doristine LocksVito Opha Mcghee , MD Age: [age] Referring MD:  Date of Birth: 1981/01/13 Gender: Female Account #: [id number] Procedure:                Upper GI endoscopy Indications:              Dysphagia, Esophageal reflux, Follow-up of reflux                            esophagitis Medicines:                Monitored Anesthesia Care Procedure:                Pre-Anesthesia Assessment:                           - Prior to the procedure, a History and Physical                            was performed, and patient medications and                            allergies were reviewed. The patient's tolerance of                            previous anesthesia was also reviewed. The risks                            and benefits of the procedure and the sedation                            options and risks were discussed with the patient.                            All questions were answered, and informed consent                            was obtained. Prior Anticoagulants: The patient has                            taken no previous anticoagulant or antiplatelet                            agents. ASA Grade Assessment: III - A patient with                            severe systemic disease. After reviewing the risks                            and benefits, the patient was deemed in                            satisfactory condition to undergo the procedure.  After obtaining informed consent, the endoscope was                            passed under direct vision. Throughout the                            procedure, the patient's blood pressure, pulse, and                            oxygen saturations were monitored continuously. The                            Olympus PCF-190 [id number]) Colonoscope was                            introduced through the mouth, and advanced to  the                            second part of duodenum. The upper GI endoscopy was                            accomplished without difficulty. The patient                            tolerated the procedure well. Scope In: Scope Out: Findings:                 A 3 cm hiatal hernia was present.                           One benign-appearing, intrinsic mild stenosis was                            found 33 cm from the incisors. This stenosis                            measured 1 cm (in length). The stenosis was                            traversed. A TTS dilator was passed through the                            scope. Dilation with an 18-19-20 mm balloon dilator                            was performed to 19 mm. The dilation site was                            examined and showed mild mucosal disruption and                            moderate improvement in luminal narrowing.  Estimated blood loss was minimal.                           Localized mildly erythematous mucosa without                            bleeding was found in the gastric antrum. Biopsies                            were taken with a cold forceps for Helicobacter                            pylori testing. Estimated blood loss was minimal.                           The gastric fundus, gastric body and incisura were                            normal. Additional biopsies were taken with a cold                            forceps for Helicobacter pylori testing. Estimated                            blood loss was minimal.                           The examined duodenum was normal. Complications:            No immediate complications. Estimated Blood Loss:     Estimated blood loss was minimal. Impression:               - 3 cm hiatal hernia.                           - Benign-appearing esophageal stenosis. Dilated.                           - Erythematous mucosa in the antrum. Biopsied.                            - Normal gastric fundus, gastric body and incisura.                            Biopsied.                           - Normal examined duodenum. Recommendation:           - Patient has a contact number available for                            emergencies. The signs and symptoms of potential                            delayed complications were discussed with the  patient. Return to normal activities tomorrow.                            Written discharge instructions were provided to the                            patient.                           - Resume previous diet.                           - Continue present medications.                           - Continue pantoprazole (Protonix) as prescribed.                           - Await pathology results.                           - Repeat upper endoscopy PRN for retreatment.                           - Continue antireflux lifestyle and dietary                            modifications. Doristine Locks, MD 02/20/2021 11:17:10 AM

## 2021-02-20 NOTE — Patient Instructions (Signed)
Thank you for allowing [place] to care for you today!  Await pathology results, approximately 2 weeks.  Will make necessary adjustments at that time if needed.  Resume previous medications today.  Continue taking Protonix as prescribed.  Resume your normal daily activities tomorrow.  Follow a soft diet today as your were dilated.  Handout for diet provided.    YOU HAD AN ENDOSCOPIC PROCEDURE TODAY AT THE [facility]:   Refer to the procedure report that was given to you for any specific questions about what was found during the examination.  If the procedure report does not answer your questions, please call your gastroenterologist to clarify.  If you requested that your care partner not be given the details of your procedure findings, then the procedure report has been included in a sealed envelope for you to review at your convenience later.  YOU SHOULD EXPECT: Some feelings of bloating in the abdomen. Passage of more gas than usual.  Walking can help get rid of the air that was put into your GI tract during the procedure and reduce the bloating. If you had a lower endoscopy (such as a colonoscopy or flexible sigmoidoscopy) you may notice spotting of blood in your stool or on the toilet paper. If you underwent a bowel prep for your procedure, you may not have a normal bowel movement for a few days.  Please Note:  You might notice some irritation and congestion in your nose or some drainage.  This is from the oxygen used during your procedure.  There is no need for concern and it should clear up in a day or so.  SYMPTOMS TO REPORT IMMEDIATELY:    Following upper endoscopy (EGD)  Vomiting of blood or coffee ground material  New chest pain or pain under the shoulder blades  Painful or persistently difficult swallowing  New shortness of breath  Fever of 100F or higher  Black, tarry-looking stools  For urgent or emergent issues, a gastroenterologist can be reached at any hour by  calling [phone]. Do not use MyChart messaging for urgent concerns.    DIET:  We do recommend a small meal at first, but then you may proceed to your regular diet.  Drink plenty of fluids but you should avoid alcoholic beverages for 24 hours.  ACTIVITY:  You should plan to take it easy for the rest of today and you should NOT DRIVE or use heavy machinery until tomorrow (because of the sedation medicines used during the test).    FOLLOW UP: Our staff will call the number listed on your records 48-72 hours following your procedure to check on you and address any questions or concerns that you may have regarding the information given to you following your procedure. If we do not reach you, we will leave a message.  We will attempt to reach you two times.  During this call, we will ask if you have developed any symptoms of COVID 19. If you develop any symptoms (ie: fever, flu-like symptoms, shortness of breath, cough etc.) before then, please call [phone].  If you test positive for Covid 19 in the 2 weeks post procedure, please call and report this information to [place].    If any biopsies were taken you will be contacted by phone or by letter within the next 1-3 weeks.  Please call us at [phone] if you have not heard about the biopsies in 3 weeks.    SIGNATURES/CONFIDENTIALITY: You and/or your care partner have  signed paperwork which will be entered into your electronic medical record.  These signatures attest to the fact that that the information above on your After Visit Summary has been reviewed and is understood.  Full responsibility of the confidentiality of this discharge information lies with you and/or your care-partner.

## 2021-02-20 NOTE — Progress Notes (Signed)
Called to room to assist during endoscopic procedure.  Patient ID and intended procedure confirmed with present staff. Received instructions for my participation in the procedure from the performing physician.  

## 2021-03-07 ENCOUNTER — Encounter: Payer: Self-pay | Admitting: Gastroenterology

## 2021-03-14 ENCOUNTER — Other Ambulatory Visit (HOSPITAL_COMMUNITY)
Admission: RE | Admit: 2021-03-14 | Discharge: 2021-03-14 | Disposition: A | Discharge status: Home / Self Care | Payer: Medicaid Other | Source: Ambulatory Visit | Attending: Obstetrics | Admitting: Obstetrics

## 2021-03-14 ENCOUNTER — Other Ambulatory Visit: Payer: Self-pay

## 2021-03-14 ENCOUNTER — Ambulatory Visit (INDEPENDENT_AMBULATORY_CARE_PROVIDER_SITE_OTHER): Payer: Medicaid Other | Admitting: Obstetrics

## 2021-03-14 ENCOUNTER — Encounter: Payer: Self-pay | Admitting: Obstetrics

## 2021-03-14 VITALS — BP 117/78 | HR 71 | Ht 61.75 in | Wt 257.0 lb

## 2021-03-14 DIAGNOSIS — N946 Dysmenorrhea, unspecified: Secondary | ICD-10-CM

## 2021-03-14 DIAGNOSIS — Z3044 Encounter for surveillance of vaginal ring hormonal contraceptive device: Secondary | ICD-10-CM

## 2021-03-14 DIAGNOSIS — Z01419 Encounter for gynecological examination (general) (routine) without abnormal findings: Secondary | ICD-10-CM | POA: Insufficient documentation

## 2021-03-14 DIAGNOSIS — Z113 Encounter for screening for infections with a predominantly sexual mode of transmission: Secondary | ICD-10-CM

## 2021-03-14 DIAGNOSIS — N898 Other specified noninflammatory disorders of vagina: Secondary | ICD-10-CM | POA: Insufficient documentation

## 2021-03-14 DIAGNOSIS — Z6841 Body Mass Index (BMI) 40.0 and over, adult: Secondary | ICD-10-CM

## 2021-03-14 MED ORDER — IBUPROFEN 800 MG PO TABS: 800.0000 mg | ORAL_TABLET | Freq: Three times a day (TID) | ORAL | 5 refills | Status: DC | PRN

## 2021-03-14 MED ORDER — ETONOGESTREL-ETHINYL ESTRADIOL 0.12-0.015 MG/24HR VA RING: VAGINAL_RING | VAGINAL | 12 refills | Status: DC

## 2021-03-14 NOTE — Addendum Note (Signed)
Addended by: Coral Ceo A on: 03/14/2021 11:10 AM   Modules accepted: Orders
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
## 2021-03-15 LAB — CERVICOVAGINAL ANCILLARY ONLY
Bacterial Vaginitis (gardnerella): NEGATIVE
Candida Glabrata: NEGATIVE
Candida Vaginitis: NEGATIVE
Chlamydia: NEGATIVE
Comment: NEGATIVE
Comment: NEGATIVE
Comment: NEGATIVE
Comment: NEGATIVE
Comment: NEGATIVE
Comment: NORMAL
Neisseria Gonorrhea: NEGATIVE
Trichomonas: NEGATIVE

## 2021-03-18 LAB — CYTOLOGY - PAP
Comment: NEGATIVE
High risk HPV: POSITIVE — AB

## 2021-03-19 ENCOUNTER — Ambulatory Visit: Payer: Medicaid Other | Admitting: Primary Care

## 2021-03-19 LAB — RPR: RPR Ser Ql: NONREACTIVE

## 2021-03-19 LAB — HIV ANTIBODY (ROUTINE TESTING W REFLEX): HIV Screen 4th Generation wRfx: NONREACTIVE

## 2021-03-19 LAB — HEPATITIS C ANTIBODY: Hep C Virus Ab: <0.1 s/co ratio (ref 0.0–0.9)

## 2021-03-19 LAB — HEPATITIS B SURFACE ANTIGEN: Hepatitis B Surface Ag: NEGATIVE

## 2021-04-08 ENCOUNTER — Encounter: Payer: Medicaid Other | Admitting: Obstetrics

## 2021-04-17 ENCOUNTER — Encounter: Payer: Self-pay | Admitting: Obstetrics

## 2021-04-17 ENCOUNTER — Other Ambulatory Visit (HOSPITAL_COMMUNITY)
Admission: RE | Admit: 2021-04-17 | Discharge: 2021-04-17 | Disposition: A | Discharge status: Home / Self Care | Payer: Medicaid Other | Source: Ambulatory Visit | Attending: Obstetrics | Admitting: Obstetrics

## 2021-04-17 ENCOUNTER — Other Ambulatory Visit: Payer: Self-pay

## 2021-04-17 ENCOUNTER — Ambulatory Visit (INDEPENDENT_AMBULATORY_CARE_PROVIDER_SITE_OTHER): Payer: Medicaid Other | Admitting: Obstetrics

## 2021-04-17 VITALS — BP 115/74 | HR 97 | Ht 61.75 in | Wt 254.0 lb

## 2021-04-17 DIAGNOSIS — Z01812 Encounter for preprocedural laboratory examination: Secondary | ICD-10-CM

## 2021-04-17 DIAGNOSIS — R87612 Low grade squamous intraepithelial lesion on cytologic smear of cervix (LGSIL): Secondary | ICD-10-CM | POA: Insufficient documentation

## 2021-04-17 LAB — POCT URINE PREGNANCY: Preg Test, Ur: NEGATIVE

## 2021-04-17 NOTE — Progress Notes (Signed)
Here for colpo, high risk for HPV, LGSIL.  States not currently taking any of her medications because she has not "been taking good care of myself".

## 2021-04-17 NOTE — Progress Notes (Signed)
Ibuprofen 800 mg po given post colpo per Dr. Verdell Carmine verbal orders.

## 2021-04-17 NOTE — Progress Notes (Signed)
Colposcopy Procedure Note  Indications: Pap smear 1 months ago showed: low-grade squamous intraepithelial neoplasia (LGSIL - encompassing HPV,mild dysplasia,CIN I). The prior pap showed no abnormalities.  Prior cervical/vaginal disease: normal exam without visible pathology. Prior cervical treatment: no treatment.  Procedure Details  The risks and benefits of the procedure and Written informed consent obtained.  A time-out was performed confirming the patient, procedure and allergy status  Speculum placed in vagina and excellent visualization of cervix achieved, cervix swabbed x 3 with acetic acid solution.  Findings: Cervix: acetowhite lesion(s) noted at 6 and 12 o'clock and punctation noted at 6 and 12 o'clock; SCJ visualized 360 degrees without lesions, endocervical curettage performed, cervical biopsies taken at 6 and 12 o'clock, specimen labelled and sent to pathology, and hemostasis achieved with silver nitrate.   Vaginal inspection: normal without visible lesions. Vulvar colposcopy: vulvar colposcopy not performed.   Physical Exam   Specimens: ECC and Cervical Biopsies   Complications: none.  Plan: Specimens labelled and sent to Pathology. Will base further treatment on Pathology findings. Treatment options discussed with patient. Post biopsy instructions given to patient. Return to discuss Pathology results in 2 weeks.    Brock Bad, MD 04/17/2021 11:01 AM

## 2021-04-18 ENCOUNTER — Telehealth: Payer: Self-pay

## 2021-04-18 LAB — SURGICAL PATHOLOGY

## 2021-04-18 NOTE — Telephone Encounter (Signed)
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  Repeat pap in 1 year.

## 2021-04-18 NOTE — Telephone Encounter (Signed)
Patient reviewed test results via-my chart 

## 2021-05-01 ENCOUNTER — Telehealth: Payer: Medicaid Other | Admitting: Obstetrics

## 2021-05-01 DIAGNOSIS — N870 Mild cervical dysplasia: Secondary | ICD-10-CM

## 2021-05-01 NOTE — Progress Notes (Signed)
Patient did not answer phone call

## 2021-05-31 ENCOUNTER — Other Ambulatory Visit: Payer: Medicaid Other | Admitting: Obstetrics

## 2021-08-10 ENCOUNTER — Telehealth: Payer: Self-pay | Admitting: Emergency Medicine

## 2021-08-10 ENCOUNTER — Ambulatory Visit
Admission: EM | Admit: 2021-08-10 | Discharge: 2021-08-10 | Disposition: A | Discharge status: Home / Self Care | Payer: Medicaid Other | Attending: Family Medicine | Admitting: Family Medicine

## 2021-08-10 ENCOUNTER — Other Ambulatory Visit: Payer: Self-pay

## 2021-08-10 DIAGNOSIS — J40 Bronchitis, not specified as acute or chronic: Secondary | ICD-10-CM | POA: Diagnosis not present

## 2021-08-10 MED ORDER — PREDNISONE 10 MG PO TABS: ORAL_TABLET | ORAL | 0 refills | Status: DC

## 2021-08-10 MED ORDER — BENZONATATE 100 MG PO CAPS: 100.0000 mg | ORAL_CAPSULE | Freq: Three times a day (TID) | ORAL | 0 refills | Status: DC | PRN

## 2021-08-10 MED ORDER — ALBUTEROL SULFATE HFA 108 (90 BASE) MCG/ACT IN AERS: 1.0000 | INHALATION_SPRAY | RESPIRATORY_TRACT | 3 refills | Status: AC | PRN

## 2021-08-10 MED ORDER — BUDESONIDE-FORMOTEROL FUMARATE 80-4.5 MCG/ACT IN AERO: 2.0000 | INHALATION_SPRAY | Freq: Every day | RESPIRATORY_TRACT | 11 refills | Status: AC

## 2021-08-10 MED ORDER — ALBUTEROL SULFATE HFA 108 (90 BASE) MCG/ACT IN AERS: 1.0000 | INHALATION_SPRAY | RESPIRATORY_TRACT | 3 refills | Status: DC | PRN

## 2021-08-10 NOTE — Discharge Instructions (Signed)
Medications as prescribed.  Follow up with pulmonology.  Take care  Dr. Wilbert Schouten  

## 2021-08-10 NOTE — Telephone Encounter (Signed)
Patient just completing rx for an acute bronchitis, was treated with azithro + pred. Has residual dry cough and some SOB - does not have her SABA, needs a refill. She is off symbicort but may benefit from restarting depending on her recovery from this illness. She asked for her albuterol and codeine cough syrup.   I'll refill the albuterol, not the cough syrup. Will give her tessalon for now.   Please call her to get set up with an OV by APP or DR Vassie Loll to be assessed following this flare.
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
## 2021-08-10 NOTE — ED Triage Notes (Addendum)
Patient presents to Urgent Care with complaints of cough x 2 weeks. Pt was sen and treated for cough. She states she was given zpak and prednisone. Pt concerned with possible needing additional treatment. Has a hx of bronchitis.  Denies fever.

## 2021-08-12 NOTE — Telephone Encounter (Signed)
Called patient but she did not answer. Will leave in triage for follow up.

## 2021-08-13 NOTE — Telephone Encounter (Signed)
I have called and LM on VM for the pt to call us back x 2.

## 2021-08-14 ENCOUNTER — Encounter: Payer: Self-pay | Admitting: *Deleted

## 2021-08-14 NOTE — Telephone Encounter (Signed)
Letter mailed to the pt. 

## 2021-08-15 MED ORDER — BENZONATATE 100 MG PO CAPS: 100.0000 mg | ORAL_CAPSULE | Freq: Three times a day (TID) | ORAL | 0 refills | Status: DC | PRN

## 2021-08-15 NOTE — Addendum Note (Signed)
Addended by: Christen Butter on: 08/15/2021 04:44 PM   Modules accepted: Orders

## 2021-08-15 NOTE — Telephone Encounter (Signed)
Oretha Milch, MD to Me     5:08 PM  Can be assessed on OV   I called and spoke with the pt and notified of response per Dr Vassie Loll  Nothing further needed

## 2021-08-15 NOTE — Telephone Encounter (Signed)
Spoke with the pt   I scheduled her appt with TP for 08/22/21  She is asking now if we can send order for her to get a neb machine  She uses albuterol in her friend's neb but she wants her own machine

## 2021-08-22 ENCOUNTER — Ambulatory Visit: Payer: Medicaid Other | Admitting: Adult Health

## 2021-09-27 ENCOUNTER — Other Ambulatory Visit: Payer: Self-pay | Admitting: Pulmonary Disease

## 2021-09-30 ENCOUNTER — Ambulatory Visit
Admission: EM | Admit: 2021-09-30 | Discharge: 2021-09-30 | Disposition: A | Discharge status: Home / Self Care | Payer: Medicaid Other

## 2021-09-30 ENCOUNTER — Other Ambulatory Visit: Payer: Self-pay

## 2021-09-30 ENCOUNTER — Encounter: Payer: Self-pay | Admitting: Emergency Medicine

## 2021-09-30 DIAGNOSIS — J111 Influenza due to unidentified influenza virus with other respiratory manifestations: Secondary | ICD-10-CM | POA: Diagnosis not present

## 2021-09-30 DIAGNOSIS — M5431 Sciatica, right side: Secondary | ICD-10-CM | POA: Diagnosis not present

## 2021-09-30 MED ORDER — OSELTAMIVIR PHOSPHATE 75 MG PO CAPS: 75.0000 mg | ORAL_CAPSULE | Freq: Two times a day (BID) | ORAL | 0 refills | Status: DC

## 2021-09-30 MED ORDER — BENZONATATE 100 MG PO CAPS: 200.0000 mg | ORAL_CAPSULE | Freq: Three times a day (TID) | ORAL | 0 refills | Status: DC

## 2021-09-30 MED ORDER — DEXAMETHASONE SODIUM PHOSPHATE 10 MG/ML IJ SOLN
10.0000 mg | Freq: Once | INTRAMUSCULAR | Status: AC
  Administered 2021-09-30: 18:00:00 10 mg via INTRAMUSCULAR

## 2021-09-30 MED ORDER — PROMETHAZINE-PHENYLEPHRINE 6.25-5 MG/5ML PO SYRP: 5.0000 mL | ORAL_SOLUTION | Freq: Four times a day (QID) | ORAL | 0 refills | Status: DC | PRN

## 2021-09-30 MED ORDER — IPRATROPIUM BROMIDE 0.06 % NA SOLN: 2.0000 | Freq: Four times a day (QID) | NASAL | 12 refills | Status: DC

## 2021-09-30 NOTE — ED Triage Notes (Signed)
Pt presents today with c/o headache, nasal congestion/runny nose and cough x 2 days.
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
## 2021-09-30 NOTE — Discharge Instructions (Signed)
Take the Tamiflu twice daily for 5 days for treatment of influenza.  Use the Atrovent nasal spray, 2 squirts up each nostril every 6 hours, as needed for nasal congestion and runny nose.  Use over-the-counter Delsym, Zarbee's, or Robitussin during the day as needed for cough.  Use the Tessalon Perles every 8 hours as needed for cough.  Taken with a small sip of water.  You may experience some numbness to your tongue or metallic taste in her mouth, this is normal.  Use the Promethazine VC cough syrup at bedtime as will make you drowsy but it should help dry up your postnasal drip and aid you in sleep and cough relief.  Return for reevaluation, or see your primary care provider, for new or worsening symptoms.

## 2021-10-10 ENCOUNTER — Encounter: Payer: Self-pay | Admitting: Emergency Medicine

## 2021-10-10 ENCOUNTER — Ambulatory Visit
Admission: EM | Admit: 2021-10-10 | Discharge: 2021-10-10 | Disposition: A | Discharge status: Home / Self Care | Payer: Medicaid Other | Attending: Internal Medicine | Admitting: Internal Medicine

## 2021-10-10 ENCOUNTER — Other Ambulatory Visit: Payer: Self-pay | Admitting: Emergency Medicine

## 2021-10-10 ENCOUNTER — Other Ambulatory Visit: Payer: Self-pay

## 2021-10-10 DIAGNOSIS — J0100 Acute maxillary sinusitis, unspecified: Secondary | ICD-10-CM | POA: Diagnosis not present

## 2021-10-10 DIAGNOSIS — J40 Bronchitis, not specified as acute or chronic: Secondary | ICD-10-CM | POA: Diagnosis not present

## 2021-10-10 DIAGNOSIS — B309 Viral conjunctivitis, unspecified: Secondary | ICD-10-CM

## 2021-10-10 MED ORDER — PREDNISONE 50 MG PO TABS: ORAL_TABLET | ORAL | 0 refills | Status: DC

## 2021-10-10 MED ORDER — AZITHROMYCIN 250 MG PO TABS: ORAL_TABLET | ORAL | 0 refills | Status: DC

## 2021-10-10 MED ORDER — GENTAMICIN SULFATE 0.3 % OP SOLN: 1.0000 [drp] | Freq: Four times a day (QID) | OPHTHALMIC | 0 refills | Status: DC
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
## 2021-10-10 NOTE — ED Triage Notes (Signed)
Pt presents today with continued complaint of cough, nasal congestion with fever. She was seen here for same on 11/28. She also c/o of pain/redness/drainage to left lower eye lid.

## 2021-12-06 ENCOUNTER — Telehealth: Payer: Self-pay | Admitting: Podiatry

## 2021-12-06 ENCOUNTER — Other Ambulatory Visit: Payer: Self-pay

## 2021-12-06 ENCOUNTER — Encounter: Payer: Self-pay | Admitting: Podiatry

## 2021-12-06 ENCOUNTER — Ambulatory Visit (INDEPENDENT_AMBULATORY_CARE_PROVIDER_SITE_OTHER): Payer: Medicaid Other

## 2021-12-06 ENCOUNTER — Ambulatory Visit: Payer: Medicaid Other | Admitting: Podiatry

## 2021-12-06 DIAGNOSIS — M722 Plantar fascial fibromatosis: Secondary | ICD-10-CM | POA: Diagnosis not present

## 2021-12-06 MED ORDER — METHYLPREDNISOLONE 4 MG PO TBPK: ORAL_TABLET | ORAL | 0 refills | Status: AC

## 2021-12-06 MED ORDER — BETAMETHASONE SOD PHOS & ACET 6 (3-3) MG/ML IJ SUSP
3.0000 mg | Freq: Once | INTRAMUSCULAR | Status: AC
  Administered 2021-12-06: 3 mg via INTRA_ARTICULAR

## 2021-12-06 NOTE — Telephone Encounter (Signed)
Not sure about a cream? You can ask her what it's for... I may have mentioned something, but she was treated for plantar fasciitis bilateral.  -Dr. Logan Bores

## 2021-12-06 NOTE — Telephone Encounter (Signed)
Patient saw Dr Amalia Hailey today and when she was here Dr Amalia Hailey stated something about some cream. Its for pt's big toe. Patient wanted to know if it goes to the pharmacy or if we have it here. Pt's number [phone].
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
## 2021-12-09 NOTE — Telephone Encounter (Signed)
Called pt LM on VM for pt to call the office in reference to her RX for the cream she was asking for to get more information.

## 2021-12-09 NOTE — Telephone Encounter (Signed)
Spoke with patient, the cream was for the excess skin on her great toe.

## 2022-01-07 ENCOUNTER — Ambulatory Visit: Payer: Medicaid Other | Admitting: Podiatry

## 2022-01-31 ENCOUNTER — Ambulatory Visit: Payer: Medicaid Other | Admitting: Podiatry

## 2022-01-31 DIAGNOSIS — M722 Plantar fascial fibromatosis: Secondary | ICD-10-CM | POA: Diagnosis not present

## 2022-01-31 MED ORDER — BETAMETHASONE SOD PHOS & ACET 6 (3-3) MG/ML IJ SUSP
3.0000 mg | Freq: Once | INTRAMUSCULAR | Status: AC
  Administered 2022-01-31: 3 mg via INTRA_ARTICULAR

## 2022-01-31 MED ORDER — MELOXICAM 15 MG PO TABS: 15.0000 mg | ORAL_TABLET | Freq: Every day | ORAL | 1 refills | Status: DC | PRN
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
## 2022-03-04 ENCOUNTER — Ambulatory Visit: Payer: Medicaid Other | Admitting: Podiatry

## 2022-03-18 ENCOUNTER — Ambulatory Visit (INDEPENDENT_AMBULATORY_CARE_PROVIDER_SITE_OTHER): Payer: Medicaid Other | Admitting: Podiatry

## 2022-03-18 DIAGNOSIS — M722 Plantar fascial fibromatosis: Secondary | ICD-10-CM

## 2022-03-18 MED ORDER — BETAMETHASONE SOD PHOS & ACET 6 (3-3) MG/ML IJ SUSP
3.0000 mg | Freq: Once | INTRAMUSCULAR | Status: AC
  Administered 2022-03-18: 3 mg via INTRA_ARTICULAR
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
## 2022-04-28 ENCOUNTER — Other Ambulatory Visit: Payer: Self-pay | Admitting: Obstetrics

## 2022-04-28 ENCOUNTER — Other Ambulatory Visit: Payer: Self-pay | Admitting: Physician Assistant

## 2022-05-14 ENCOUNTER — Other Ambulatory Visit: Payer: Self-pay | Admitting: Physician Assistant

## 2022-05-27 ENCOUNTER — Ambulatory Visit
Admission: EM | Admit: 2022-05-27 | Discharge: 2022-05-27 | Disposition: A | Discharge status: Home / Self Care | Payer: Medicaid Other | Attending: Emergency Medicine | Admitting: Emergency Medicine

## 2022-05-27 ENCOUNTER — Encounter: Payer: Self-pay | Admitting: Emergency Medicine

## 2022-05-27 DIAGNOSIS — G43009 Migraine without aura, not intractable, without status migrainosus: Secondary | ICD-10-CM | POA: Diagnosis not present

## 2022-05-27 MED ORDER — ONDANSETRON 8 MG PO TBDP: 8.0000 mg | ORAL_TABLET | Freq: Three times a day (TID) | ORAL | 0 refills | Status: DC | PRN

## 2022-05-27 MED ORDER — ONDANSETRON 8 MG PO TBDP
8.0000 mg | ORAL_TABLET | Freq: Once | ORAL | Status: AC
  Administered 2022-05-27: 8 mg via ORAL

## 2022-05-27 MED ORDER — SUMATRIPTAN SUCCINATE 6 MG/0.5ML ~~LOC~~ SOLN
6.0000 mg | Freq: Once | SUBCUTANEOUS | Status: AC
  Administered 2022-05-27: 6 mg via SUBCUTANEOUS

## 2022-05-27 MED ORDER — RIZATRIPTAN BENZOATE 10 MG PO TABS: 10.0000 mg | ORAL_TABLET | ORAL | 0 refills | Status: DC | PRN
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
## 2022-05-27 NOTE — Discharge Instructions (Addendum)
Go home and rest in a cool dark environment.  Use over-the-counter ibuprofen or Aleve as needed for mild to moderate pain.  You can take Maxalt 10 mg as needed for severe symptoms.  You may repeat your dose in 2 hours if no resolution.  Use the Zofran every 8 hours as needed for nausea and vomiting.  If your headache does not resolve, or worsens, please go to the ER for evaluation.

## 2022-05-27 NOTE — ED Triage Notes (Signed)
Pt presents with a migraine that started today. She has light sensitivity, nausea and HA. She does report a history of migraines

## 2022-08-06 ENCOUNTER — Other Ambulatory Visit: Payer: Self-pay | Admitting: Physician Assistant

## 2022-08-19 ENCOUNTER — Ambulatory Visit (INDEPENDENT_AMBULATORY_CARE_PROVIDER_SITE_OTHER): Payer: Medicaid Other

## 2022-08-19 ENCOUNTER — Ambulatory Visit: Payer: Medicaid Other | Admitting: Podiatry

## 2022-08-19 DIAGNOSIS — M7752 Other enthesopathy of left foot: Secondary | ICD-10-CM
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
## 2022-09-13 IMAGING — DX DG CHEST 2V
2 series · 2 of 2 positions shown · non-contrast
Comparison: Chest x-ray from 2773.

CLINICAL DATA: Cough.

EXAM:
CHEST - 2 VIEW

[chest pa]
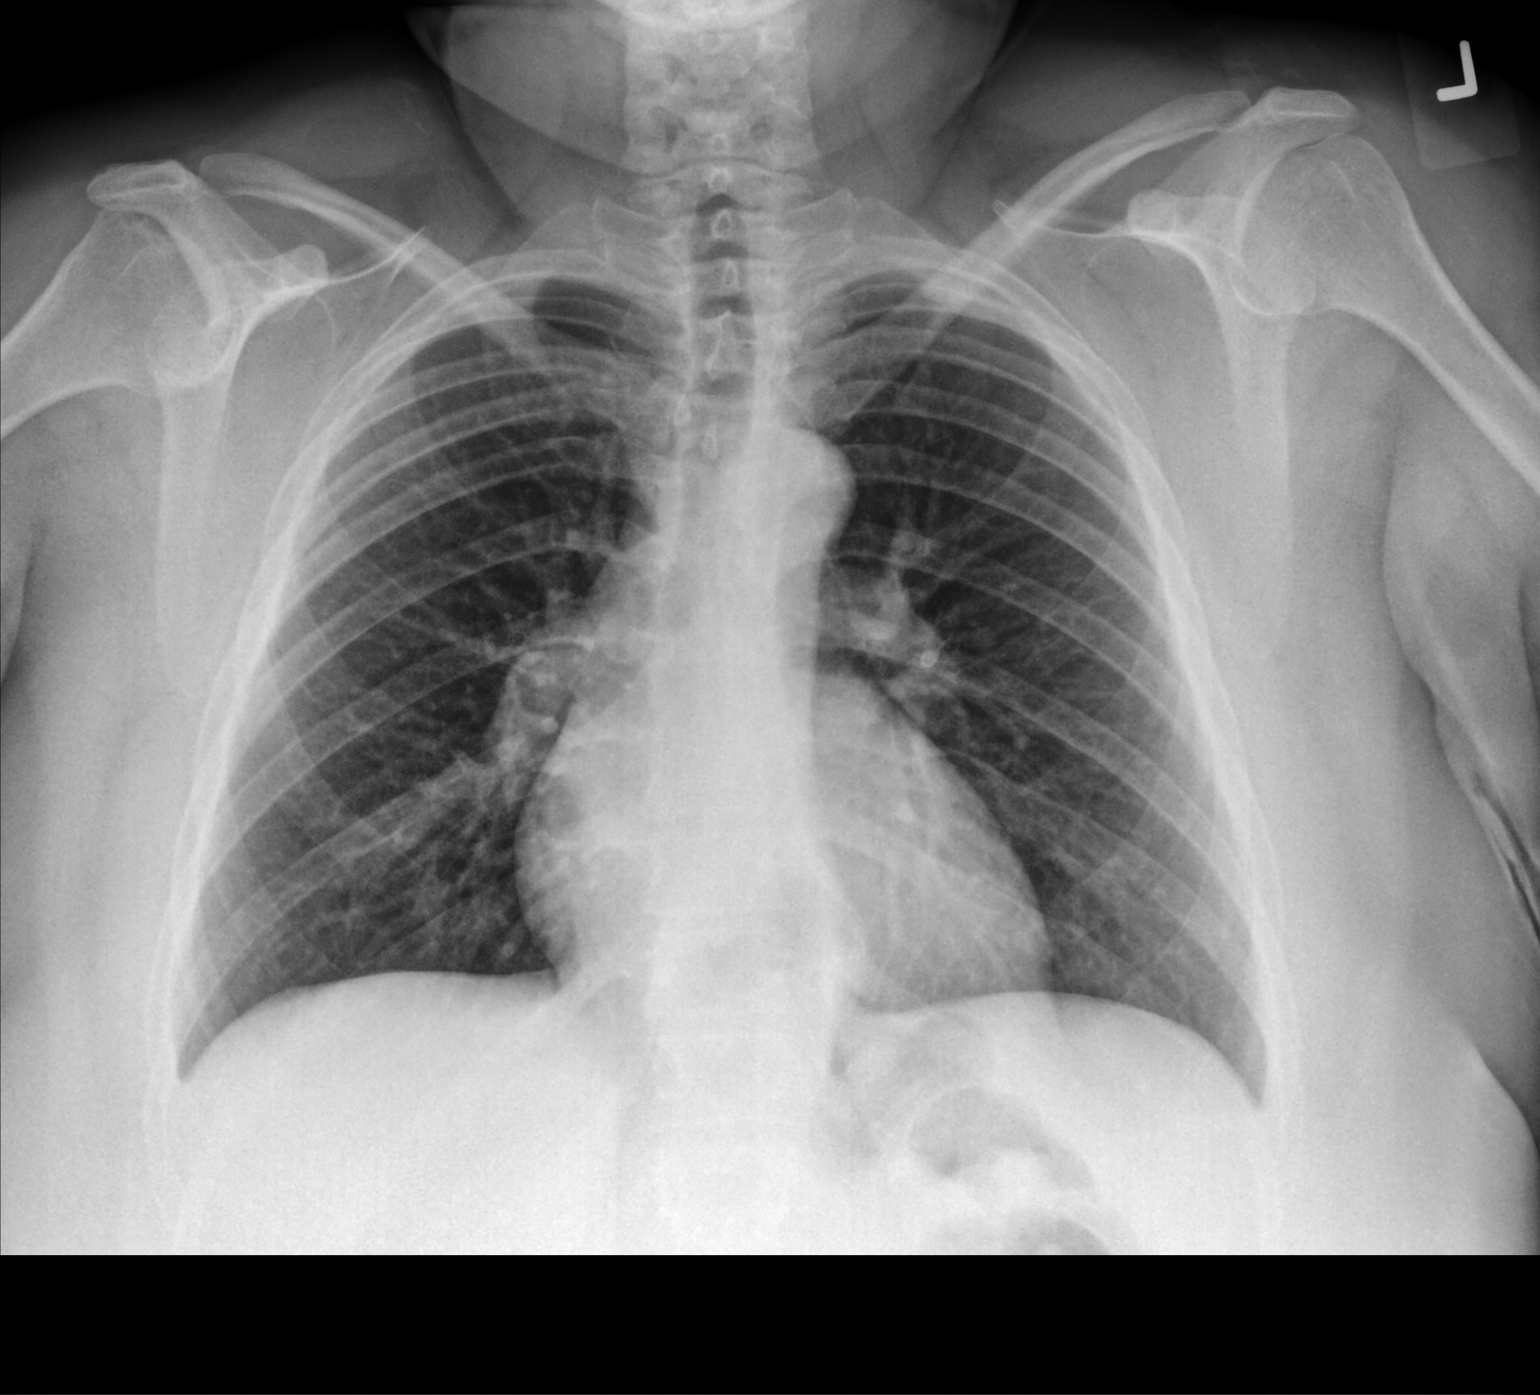

[chest lat]
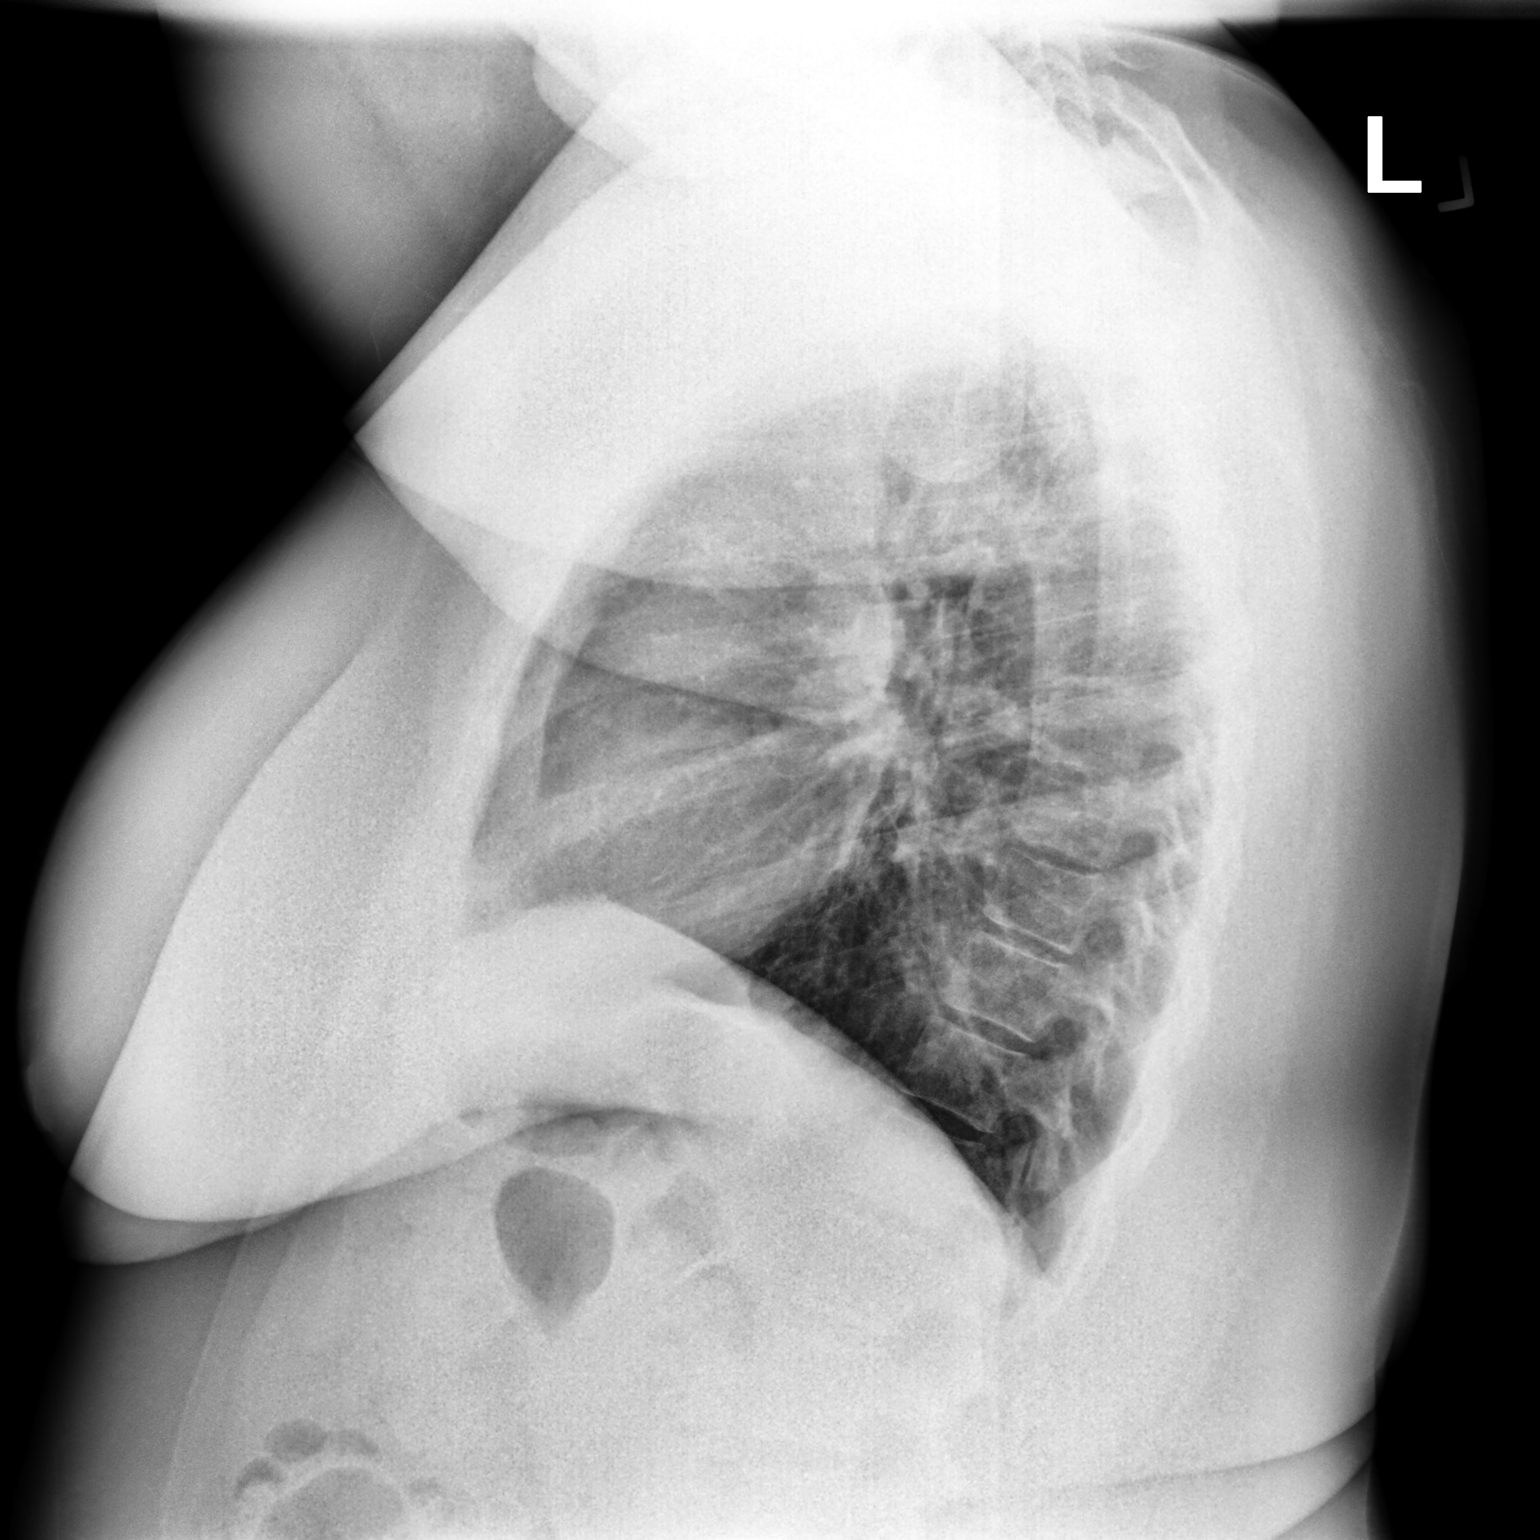

[2 of 2 positions shown; findings below may reference images not displayed]

FINDINGS: The cardiac silhouette, mediastinal and hilar contours are within
normal limits.

The lungs are clear. No infiltrates, edema or effusions. No
pulmonary lesions or pneumothorax.

Hiatal hernia noted.

The bony structures are unremarkable.
IMPRESSION: No acute cardiopulmonary findings.

Hiatal hernia.

## 2022-10-04 ENCOUNTER — Other Ambulatory Visit: Payer: Self-pay | Admitting: Podiatry

## 2022-10-13 ENCOUNTER — Other Ambulatory Visit: Payer: Self-pay | Admitting: Physician Assistant

## 2022-10-13 ENCOUNTER — Other Ambulatory Visit: Payer: Self-pay | Admitting: Family Medicine

## 2022-11-01 ENCOUNTER — Other Ambulatory Visit: Payer: Self-pay | Admitting: Pulmonary Disease

## 2022-11-11 ENCOUNTER — Other Ambulatory Visit: Payer: Self-pay | Admitting: Obstetrics and Gynecology

## 2022-12-07 ENCOUNTER — Encounter: Payer: Self-pay | Admitting: Emergency Medicine

## 2022-12-07 ENCOUNTER — Ambulatory Visit
Admission: EM | Admit: 2022-12-07 | Discharge: 2022-12-07 | Disposition: A | Discharge status: Home / Self Care | Payer: Medicaid Other | Attending: Emergency Medicine | Admitting: Emergency Medicine

## 2022-12-07 DIAGNOSIS — J0100 Acute maxillary sinusitis, unspecified: Secondary | ICD-10-CM

## 2022-12-07 DIAGNOSIS — Z1152 Encounter for screening for COVID-19: Secondary | ICD-10-CM | POA: Diagnosis not present

## 2022-12-07 LAB — RESP PANEL BY RT-PCR (RSV, FLU A&B, COVID)  RVPGX2
Influenza A by PCR: NEGATIVE
Influenza B by PCR: NEGATIVE
Resp Syncytial Virus by PCR: NEGATIVE
SARS Coronavirus 2 by RT PCR: NEGATIVE

## 2022-12-07 MED ORDER — IPRATROPIUM BROMIDE 0.03 % NA SOLN: 2.0000 | Freq: Two times a day (BID) | NASAL | 12 refills | Status: AC

## 2022-12-07 MED ORDER — AZITHROMYCIN 250 MG PO TABS
250.0000 mg | ORAL_TABLET | Freq: Every day | ORAL | 0 refills | Status: AC
Start: 2022-12-10 — End: ?

## 2022-12-07 MED ORDER — PREDNISONE 10 MG (21) PO TBPK: ORAL_TABLET | Freq: Every day | ORAL | 0 refills | Status: AC

## 2022-12-07 MED ORDER — BENZONATATE 100 MG PO CAPS: 100.0000 mg | ORAL_CAPSULE | Freq: Three times a day (TID) | ORAL | 0 refills | Status: AC

## 2022-12-07 NOTE — ED Triage Notes (Signed)
Patient c/o sinus congestion and pressure, nasal congestion, and HA that started 3 days ago.
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
## 2022-12-07 NOTE — Discharge Instructions (Addendum)
Today you are being treated for sinus infection, your symptoms today are most likely being caused by a virus and should steadily improve in time it can take up to 7 to 10 days before you truly start to see a turnaround however things will get better if you have seen no improvement with supportive care by day 7 which is Wednesday you may pick up azithromycin from the pharmacy and begin use  Covid , flu and rsv test negative4   In the meantime begin prednisone to help reduce inflammation throughout the body  You may use nasal spray every morning and every evening to further help clear the sinuses  You may continue use of pseudoephedrine, begin use of Mucinex or Robitussin in addition to further thin secretions  You may use Tessalon pill as needed for coughing    You can take Tylenol and/or Ibuprofen as needed for fever reduction and pain relief.   For cough: honey 1/2 to 1 teaspoon (you can dilute the honey in water or another fluid).  You can also use guaifenesin and dextromethorphan for cough. You can use a humidifier for chest congestion and cough.  If you don't have a humidifier, you can sit in the bathroom with the hot shower running.      For sore throat: try warm salt water gargles, cepacol lozenges, throat spray, warm tea or water with lemon/honey, popsicles or ice, or OTC cold relief medicine for throat discomfort.   For congestion: take a daily anti-histamine like Zyrtec, Claritin, and a oral decongestant, such as pseudoephedrine.  You can also use Flonase 1-2 sprays in each nostril daily.   It is important to stay hydrated: drink plenty of fluids (water, gatorade/powerade/pedialyte, juices, or teas) to keep your throat moisturized and help further relieve irritation/discomfort.

## 2023-01-02 ENCOUNTER — Other Ambulatory Visit: Payer: Self-pay | Admitting: Podiatry

## 2023-01-26 ENCOUNTER — Ambulatory Visit: Payer: Medicaid Other | Admitting: Podiatry

## 2023-02-10 ENCOUNTER — Other Ambulatory Visit: Payer: Self-pay | Admitting: Podiatry

## 2023-03-05 ENCOUNTER — Other Ambulatory Visit: Payer: Self-pay | Admitting: Obstetrics and Gynecology

## 2023-03-05 DIAGNOSIS — Z1231 Encounter for screening mammogram for malignant neoplasm of breast: Secondary | ICD-10-CM

## 2023-04-14 ENCOUNTER — Ambulatory Visit
Admission: EM | Admit: 2023-04-14 | Discharge: 2023-04-14 | Disposition: A | Discharge status: Home / Self Care | Payer: Medicaid Other | Attending: Emergency Medicine | Admitting: Emergency Medicine

## 2023-04-14 ENCOUNTER — Ambulatory Visit: Admit: 2023-04-14 | Payer: Medicaid Other

## 2023-04-14 DIAGNOSIS — G43009 Migraine without aura, not intractable, without status migrainosus: Secondary | ICD-10-CM

## 2023-04-14 MED ORDER — ONDANSETRON 8 MG PO TBDP
8.0000 mg | ORAL_TABLET | Freq: Once | ORAL | Status: AC
  Administered 2023-04-14: 8 mg via ORAL

## 2023-04-14 MED ORDER — SUMATRIPTAN SUCCINATE 6 MG/0.5ML ~~LOC~~ SOLN
6.0000 mg | Freq: Once | SUBCUTANEOUS | Status: AC
  Administered 2023-04-14: 6 mg via SUBCUTANEOUS

## 2023-04-14 MED ORDER — ONDANSETRON 8 MG PO TBDP
8.0000 mg | ORAL_TABLET | Freq: Three times a day (TID) | ORAL | 0 refills | Status: DC | PRN
Start: 2023-04-14 — End: 2024-01-25

## 2023-04-14 MED ORDER — RIZATRIPTAN BENZOATE 10 MG PO TABS
10.0000 mg | ORAL_TABLET | ORAL | 0 refills | Status: AC | PRN
Start: 2023-04-14 — End: ?

## 2023-04-14 NOTE — ED Triage Notes (Signed)
Patient presents to UC for HA, light sensitivity, nausea since this morning. Has a hx of migraines. Took Excedrin.
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
## 2023-04-14 NOTE — Discharge Instructions (Addendum)
Go home and rest in a cool dark environment.  Use over-the-counter ibuprofen or Aleve as needed for mild to moderate pain.  You can take Maxalt 10 mg as needed for severe symptoms.  You may repeat your dose in 2 hours if no resolution.  Use the Zofran every 8 hours as needed for nausea and vomiting.  If your headache does not resolve, or worsens, please go to the ER for evaluation. 

## 2023-04-28 ENCOUNTER — Ambulatory Visit: Payer: Medicaid Other

## 2023-05-20 ENCOUNTER — Ambulatory Visit
Admission: RE | Admit: 2023-05-20 | Discharge: 2023-05-20 | Disposition: A | Discharge status: Home / Self Care | Payer: Medicaid Other | Source: Ambulatory Visit | Attending: Obstetrics and Gynecology | Admitting: Obstetrics and Gynecology

## 2023-05-20 DIAGNOSIS — Z1231 Encounter for screening mammogram for malignant neoplasm of breast: Secondary | ICD-10-CM | POA: Diagnosis present

## 2023-06-29 ENCOUNTER — Other Ambulatory Visit: Payer: Self-pay | Admitting: Podiatry

## 2023-11-07 ENCOUNTER — Other Ambulatory Visit: Payer: Self-pay | Admitting: Podiatry

## 2024-01-25 ENCOUNTER — Emergency Department

## 2024-01-25 ENCOUNTER — Other Ambulatory Visit: Payer: Self-pay

## 2024-01-25 ENCOUNTER — Emergency Department
Admission: EM | Admit: 2024-01-25 | Discharge: 2024-01-25 | Disposition: A | Discharge status: Home / Self Care | Attending: Emergency Medicine | Admitting: Emergency Medicine

## 2024-01-25 DIAGNOSIS — R112 Nausea with vomiting, unspecified: Secondary | ICD-10-CM | POA: Insufficient documentation

## 2024-01-25 DIAGNOSIS — R197 Diarrhea, unspecified: Secondary | ICD-10-CM | POA: Diagnosis not present

## 2024-01-25 DIAGNOSIS — J45909 Unspecified asthma, uncomplicated: Secondary | ICD-10-CM | POA: Diagnosis not present

## 2024-01-25 DIAGNOSIS — R1031 Right lower quadrant pain: Secondary | ICD-10-CM | POA: Insufficient documentation

## 2024-01-25 LAB — URINALYSIS, ROUTINE W REFLEX MICROSCOPIC
Bacteria, UA: NONE SEEN
Bilirubin Urine: NEGATIVE
Glucose, UA: NEGATIVE mg/dL
Ketones, ur: NEGATIVE mg/dL
Leukocytes,Ua: NEGATIVE
Nitrite: NEGATIVE
Protein, ur: NEGATIVE mg/dL
Specific Gravity, Urine: 1.021 (ref 1.005–1.030)
pH: 5 (ref 5.0–8.0)

## 2024-01-25 LAB — COMPREHENSIVE METABOLIC PANEL
ALT: 17 U/L (ref 0–44)
AST: 15 U/L (ref 15–41)
Albumin: 3.2 g/dL — ABNORMAL LOW (ref 3.5–5.0)
Alkaline Phosphatase: 54 U/L (ref 38–126)
Anion gap: 9 (ref 5–15)
BUN: 12 mg/dL (ref 6–20)
CO2: 24 mmol/L (ref 22–32)
Calcium: 8.8 mg/dL — ABNORMAL LOW (ref 8.9–10.3)
Chloride: 108 mmol/L (ref 98–111)
Creatinine, Ser: 0.64 mg/dL (ref 0.44–1.00)
GFR, Estimated: >60 mL/min (ref >60)
Glucose, Bld: 110 mg/dL — ABNORMAL HIGH (ref 70–99)
Potassium: 3.9 mmol/L (ref 3.5–5.1)
Sodium: 141 mmol/L (ref 135–145)
Total Bilirubin: 0.9 mg/dL (ref 0.0–1.2)
Total Protein: 7.3 g/dL (ref 6.5–8.1)

## 2024-01-25 LAB — CBC
HCT: 38.4 % (ref 36.0–46.0)
Hemoglobin: 12 g/dL (ref 12.0–15.0)
MCH: 23.4 pg — ABNORMAL LOW (ref 26.0–34.0)
MCHC: 31.3 g/dL (ref 30.0–36.0)
MCV: 75 fL — ABNORMAL LOW (ref 80.0–100.0)
Platelets: 384 10*3/uL (ref 150–400)
RBC: 5.12 MIL/uL — ABNORMAL HIGH (ref 3.87–5.11)
RDW: 17.2 % — ABNORMAL HIGH (ref 11.5–15.5)
WBC: 10.4 10*3/uL (ref 4.0–10.5)
nRBC: 0 % (ref 0.0–0.2)

## 2024-01-25 LAB — LIPASE, BLOOD: Lipase: 32 U/L (ref 11–51)

## 2024-01-25 LAB — POC URINE PREG, ED: Preg Test, Ur: NEGATIVE

## 2024-01-25 MED ORDER — IOHEXOL 300 MG/ML  SOLN
100.0000 mL | Freq: Once | INTRAMUSCULAR | Status: AC | PRN
  Administered 2024-01-25: 100 mL via INTRAVENOUS

## 2024-01-25 MED ORDER — ONDANSETRON HCL 4 MG/2ML IJ SOLN
4.0000 mg | INTRAMUSCULAR | Status: AC
  Administered 2024-01-25: 4 mg via INTRAVENOUS
  Filled 2024-01-25: qty 2

## 2024-01-25 MED ORDER — ONDANSETRON 4 MG PO TBDP: 4.0000 mg | ORAL_TABLET | Freq: Four times a day (QID) | ORAL | 0 refills | Status: AC | PRN

## 2024-01-25 MED ORDER — LOPERAMIDE HCL 2 MG PO CAPS
4.0000 mg | ORAL_CAPSULE | Freq: Once | ORAL | Status: AC
  Administered 2024-01-25: 4 mg via ORAL
  Filled 2024-01-25: qty 2

## 2024-01-25 MED ORDER — SODIUM CHLORIDE 0.9 % IV BOLUS
1000.0000 mL | Freq: Once | INTRAVENOUS | Status: AC
  Administered 2024-01-25: 1000 mL via INTRAVENOUS

## 2024-01-25 NOTE — ED Provider Notes (Signed)
 [facility] Provider Note    Event Date/Time   First MD Initiated Contact with Patient 01/25/24 [phone]     (approximate)   History   Emesis   HPI  Amber Wall is a 43 y.o. female with a history of asthma previous vaginal delivery.  Last menstrual cycle was about a week ago, denies pregnancy  Patient reports yesterday she ate [place] food that she believes was bad.  Not long after eating and she started feeling nauseated began vomiting and then throughout the evening had very loose watery stools.  She relates she is not having noticed any abdominal pain.  She has no abdominal surgeries  She has not traveled anywhere outside the country.  She believes that she likely picked up at food.  She has not had a fevers or chills.  No pain or burning with urination.  Patient reports she feels "dehydrated".  She has had multiple rounds of vomiting as well as loose watery stool.  Nonbloody.     Physical Exam   Triage Vital Signs: ED Triage Vitals  Encounter Vitals Group     BP 01/25/24 0839 (!) 132/98     Systolic BP Percentile --      Diastolic BP Percentile --      Pulse Rate 01/25/24 0839 (!) 101     Resp 01/25/24 0839 18     Temp 01/25/24 0839 98.5 F (36.9 C)     Temp Source 01/25/24 0839 Oral     SpO2 01/25/24 0839 100 %     Weight 01/25/24 0838 253 lb 15.5 oz (115.2 kg)     Height 01/25/24 0838 5\' 1"  (1.549 m)     Head Circumference --      Peak Flow --      Pain Score 01/25/24 0840 0     Pain Loc --      Pain Education --      Exclude from Growth Chart --     Most recent vital signs: Vitals:   01/25/24 0839  BP: (!) 132/98  Pulse: (!) 101  Resp: 18  Temp: 98.5 F (36.9 C)  SpO2: 100%     General: Awake, no distress.  Ambulatory in the room just walked back from the bathroom reports she just had another loose watery stool. CV:  Good peripheral perfusion.  Normal tones and rate.  No tachycardia mucous membranes do appear  moist Resp:  Normal effort.  Abd:  No distention.  Soft nontender nondistended through all quadrants with exception of the right lower quadrant where she reports mild but reproducible pain.  Does not have any obvious peritonitis but there is focality in the right lower quadrant. Other:  Warm well-perfused.   ED Results / Procedures / Treatments   Labs (all labs ordered are listed, but only abnormal results are displayed) Labs Reviewed  CBC - Abnormal; Notable for the following components:      Result Value   RBC 5.12 (*)    MCV 75.0 (*)    MCH 23.4 (*)    RDW 17.2 (*)    All other components within normal limits  COMPREHENSIVE METABOLIC PANEL - Abnormal; Notable for the following components:   Glucose, Bld 110 (*)    Calcium 8.8 (*)    Albumin 3.2 (*)    All other components within normal limits  URINALYSIS, ROUTINE W REFLEX MICROSCOPIC - Abnormal; Notable for the following components:   Color, Urine YELLOW (*)  APPearance HAZY (*)    Hgb urine dipstick LARGE (*)    All other components within normal limits  LIPASE, BLOOD  POC URINE PREG, ED       RADIOLOGY  CT ABDOMEN PELVIS W CONTRAST Result Date: 01/25/2024 CLINICAL DATA:  RLQ abdominal pain nausea vomiting dirrhea and RLQ pain on exam. EXAM: CT ABDOMEN AND PELVIS WITH CONTRAST TECHNIQUE: Multidetector CT imaging of the abdomen and pelvis was performed using the standard protocol following bolus administration of intravenous contrast. RADIATION DOSE REDUCTION: This exam was performed according to the departmental dose-optimization program which includes automated exposure control, adjustment of the mA and/or kV according to patient size and/or use of iterative reconstruction technique. CONTRAST:  OMNIPAQUE IOHEXOL 300 MG/ML  SOLN COMPARISON:  None Available. FINDINGS: Lower chest: The lung bases are clear. No pleural effusion. The heart is normal in size. No pericardial effusion. Hepatobiliary: The liver is mildly  enlarged in size (length 17 cm). Non-cirrhotic configuration. No suspicious mass. No intrahepatic or extrahepatic bile duct dilation. No calcified gallstones. Normal gallbladder wall thickness. No pericholecystic inflammatory changes. Pancreas: Unremarkable. No pancreatic ductal dilatation or surrounding inflammatory changes. Spleen: Within normal limits. No focal lesion. Adrenals/Urinary Tract: Adrenal glands are unremarkable. No suspicious renal mass. No hydronephrosis. No renal or ureteric calculi. Urinary bladder is under distended, precluding optimal assessment. However, no large mass or stones identified. No perivesical fat stranding. Stomach/Bowel: Small-to-moderate sliding hiatal hernia noted. No disproportionate dilation of the small or large bowel loops. No evidence of abnormal bowel wall thickening or inflammatory changes. The appendix is unremarkable. There are scattered diverticula mainly in the sigmoid colon, without imaging signs of diverticulitis. Vascular/Lymphatic: No ascites or pneumoperitoneum. No abdominal or pelvic lymphadenopathy, by size criteria. No aneurysmal dilation of the major abdominal arteries. Reproductive: Evaluation of reproductive organs is limited on the CT scan exam. However, having said that note is made of lobulated in bulky uterus containing multiple ill-defined hypoattenuating areas as well as exophytic lesion arising from the right side of the uterine body, favored to represent leiomyomas. No large adnexal mass seen. Other: There is a small fat containing periumbilical hernia. The soft tissues and abdominal wall are otherwise unremarkable. Musculoskeletal: No suspicious osseous lesions. IMPRESSION: 1. No acute inflammatory process identified within the abdomen or pelvis. Unremarkable appendix. 2. Multiple other nonacute observations (such as mild hepatomegaly, small-to-moderate sliding hiatal hernia, leiomyomatous uterus, etc.), As described above. Electronically Signed    By: Jules Schick M.D.   On: 01/25/2024 11:40   Discussed with patient CT results, patient actually reports she was already aware and at 1 point think she was supposed to have follow-up for uterine fibroids and advised her mother is suffering with fibroid uterus    PROCEDURES:  Critical Care performed: No  Procedures   MEDICATIONS ORDERED IN ED: Medications  sodium chloride 0.9 % bolus 1,000 mL (1,000 mLs Intravenous New Bag/Given 01/25/24 0957)  ondansetron (ZOFRAN) injection 4 mg (4 mg Intravenous Given 01/25/24 0958)  loperamide (IMODIUM) capsule 4 mg (4 mg Oral Given 01/25/24 1000)  iohexol (OMNIPAQUE) 300 MG/ML solution 100 mL (100 mLs Intravenous Contrast Given 01/25/24 1034)     IMPRESSION / MDM / ASSESSMENT AND PLAN / ED COURSE  I reviewed the triage vital signs and the nursing notes.                              Differential diagnosis includes but is not limited to,  abdominal perforation, aortic dissection, cholecystitis, appendicitis, diverticulitis, colitis, esophagitis/gastritis, kidney stone, pyelonephritis, urinary tract infection, aortic aneurysm. All are considered in decision and treatment plan. Based upon the patient's presentation and risk factors, I am most suspicious this could be an acute self-limited gastrointestinal symptomatology possible foodborne illness, viral cause etc.  However she also has reproducible discomfort only in the right lower quadrant on exam though my pretest probability for appendicitis is relatively low we will proceed with CT imaging to evaluate and exclude appendicitis.  No associated acute urinary symptoms denies pregnancy.  Will provide Zofran and Imodium and IV fluid.  She reports no severe or significant pain while resting, only when pressing on the right lower quadrant.  Ambulatory.  Nontoxic.  No recent risk factors for C. difficile  Patient's presentation is most consistent with acute complicated illness / injury requiring diagnostic  workup.   Labs and imaging very reassuring.  Patient resting comfortably.  Fully alert and oriented.  Appears in no acute distress will prescribe Zofran discussed traditional return precautions.  Suspect self-limited gastrointestinal illness at this time  Return precautions and treatment recommendations and follow-up discussed with the patient who is agreeable with the plan.        FINAL CLINICAL IMPRESSION(S) / ED DIAGNOSES   Final diagnoses:  Nausea vomiting and diarrhea     Rx / DC Orders   ED Discharge Orders          Ordered    ondansetron (ZOFRAN-ODT) 4 MG disintegrating tablet  Every 6 hours PRN        01/25/24 1204             Note:  This document was prepared using Dragon voice recognition software and may include unintentional dictation errors.   Sharyn Creamer, MD 01/25/24 [phone]

## 2024-01-25 NOTE — ED Notes (Signed)
 See triage notes. Patient c/o N/V/D for about the past 12 hours. Patient believes that it is food poisoning after eating chinese food that was left out for multiple hours yesterday.

## 2024-01-25 NOTE — Discharge Instructions (Addendum)
? ?  Please return to the emergency room right away if you are to develop a fever, severe nausea, your pain becomes severe or worsens, you are unable to keep food down, begin vomiting any dark or bloody fluid, you develop any dark or bloody stools, feel dehydrated, or other new concerns or symptoms arise. ? ?

## 2024-01-25 NOTE — ED Triage Notes (Addendum)
 Pt states N/V/D since 2200 last night. Pt denies fevers. NAD noted. Pt states possible food poisoning from left out chinese food.

## 2024-06-06 ENCOUNTER — Ambulatory Visit: Admitting: Urology

## 2024-06-06 VITALS — BP 120/83 | HR 105

## 2024-06-06 DIAGNOSIS — R32 Unspecified urinary incontinence: Secondary | ICD-10-CM

## 2024-06-06 DIAGNOSIS — N3946 Mixed incontinence: Secondary | ICD-10-CM | POA: Diagnosis not present

## 2024-06-06 LAB — URINALYSIS, COMPLETE
Bilirubin, UA: NEGATIVE
Glucose, UA: NEGATIVE
Ketones, UA: NEGATIVE
Leukocytes,UA: NEGATIVE
Nitrite, UA: NEGATIVE
Protein,UA: NEGATIVE
RBC, UA: NEGATIVE
Specific Gravity, UA: 1.03 (ref 1.005–1.030)
Urobilinogen, Ur: 0.2 mg/dL (ref 0.2–1.0)
pH, UA: 6 (ref 5.0–7.5)

## 2024-06-06 LAB — MICROSCOPIC EXAMINATION: Epithelial Cells (non renal): >10 /HPF — AB (ref 0–10)
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
## 2024-06-06 NOTE — Patient Instructions (Signed)

## 2024-06-10 LAB — CULTURE, URINE COMPREHENSIVE

## 2024-07-18 ENCOUNTER — Emergency Department
Admission: EM | Admit: 2024-07-18 | Discharge: 2024-07-18 | Disposition: A | Discharge status: Home / Self Care | Attending: Emergency Medicine | Admitting: Emergency Medicine

## 2024-07-18 ENCOUNTER — Ambulatory Visit: Admitting: Urology

## 2024-07-18 ENCOUNTER — Other Ambulatory Visit: Payer: Self-pay

## 2024-07-18 DIAGNOSIS — G43909 Migraine, unspecified, not intractable, without status migrainosus: Secondary | ICD-10-CM | POA: Diagnosis present

## 2024-07-18 MED ORDER — METOCLOPRAMIDE HCL 5 MG/ML IJ SOLN
20.0000 mg | Freq: Once | INTRAVENOUS | Status: AC
  Administered 2024-07-18: 20 mg via INTRAVENOUS
  Filled 2024-07-18 (×2): qty 4

## 2024-07-18 MED ORDER — DIPHENHYDRAMINE HCL 50 MG/ML IJ SOLN
25.0000 mg | Freq: Once | INTRAMUSCULAR | Status: AC
  Administered 2024-07-18: 25 mg via INTRAVENOUS
  Filled 2024-07-18: qty 1

## 2024-07-18 MED ORDER — KETOROLAC TROMETHAMINE 30 MG/ML IJ SOLN
30.0000 mg | Freq: Once | INTRAMUSCULAR | Status: AC
Start: 2024-07-18 — End: 2024-07-18
  Administered 2024-07-18: 30 mg via INTRAVENOUS
  Filled 2024-07-18: qty 1

## 2024-07-18 MED ORDER — SODIUM CHLORIDE 0.9 % IV BOLUS
500.0000 mL | Freq: Once | INTRAVENOUS | Status: AC
  Administered 2024-07-18: 500 mL via INTRAVENOUS

## 2024-07-18 NOTE — Discharge Instructions (Signed)
You were seen in the ER today for evaluation of your headache. Your exam here was fortunately reassuring. Please arrange follow-up with a primary care doctor within the next few days for reevaluation if your symptoms have not improved. Return to the ER if you develop new or worsening headache, fever, neck stiffness, changes in vision, difficulty walking, weakness, dizziness, confusion, vomiting, numbness, tingling, or any other new or concerning symptoms that you believe warrants immediate attention.

## 2024-07-18 NOTE — ED Provider Notes (Signed)
 Care of this patient assumed from prior physician at 1500 pending reassessment after medication. Please see prior physician note for further details.  Briefly this is a 43 year old female who reports a history of migraines presenting to the emergency department for evaluation of headache typical for her.  Reassuring neurologic exam.  Patient reassessed after receiving medications here.  Does report significant improvement in her headache and light sensitivity.  She is comfortable with discharge home.  No new complaints.  Strict return precautions provided.  Patient discharged in stable condition.   Levander Slate, MD 07/18/24 [phone]

## 2024-07-18 NOTE — ED Provider Notes (Signed)
 [facility] Provider Note    Event Date/Time   First MD Initiated Contact with Patient 07/18/24 1354     (approximate)   History   Migraine   HPI  Amber Wall is a 43 y.o. female with a long history of migraines who presents with complaints of full-blown migraine .  She reports that started this morning and was mild but then became worse, she took her triptan with little improvement, she reports when this happens she needs migraine cocktail, no neurodeficits, this feels the same as many prior migraines     Physical Exam   Triage Vital Signs: ED Triage Vitals  Encounter Vitals Group     BP 07/18/24 1350 129/69     Girls Systolic BP Percentile --      Girls Diastolic BP Percentile --      Boys Systolic BP Percentile --      Boys Diastolic BP Percentile --      Pulse Rate 07/18/24 1350 73     Resp 07/18/24 1350 18     Temp 07/18/24 1350 98.5 F (36.9 C)     Temp Source 07/18/24 1350 Oral     SpO2 07/18/24 1350 100 %     Weight 07/18/24 1348 125.2 kg (276 lb)     Height 07/18/24 1348 1.549 m (5' 1)     Head Circumference --      Peak Flow --      Pain Score 07/18/24 1347 10     Pain Loc --      Pain Education --      Exclude from Growth Chart --     Most recent vital signs: Vitals:   07/18/24 1350  BP: 129/69  Pulse: 73  Resp: 18  Temp: 98.5 F (36.9 C)  SpO2: 100%     General: Awake, no distress.  CV:  Good peripheral perfusion.  Resp:  Normal effort.  Abd:  No distention.  Other:  Normal neurologic exam, she is covering her eyes due to light sensitivity   ED Results / Procedures / Treatments   Labs (all labs ordered are listed, but only abnormal results are displayed) Labs Reviewed - No data to display   EKG     RADIOLOGY     PROCEDURES:  Critical Care performed:   Procedures   MEDICATIONS ORDERED IN ED: Medications  diphenhydrAMINE  (BENADRYL ) injection 25 mg (has no administration in time range)   ketorolac  (TORADOL ) 30 MG/ML injection 30 mg (has no administration in time range)  metoCLOPramide  (REGLAN ) 20 mg in dextrose 5 % 50 mL IVPB (has no administration in time range)  sodium chloride  0.9 % bolus 500 mL (has no administration in time range)     IMPRESSION / MDM / ASSESSMENT AND PLAN / ED COURSE  I reviewed the triage vital signs and the nursing notes. Patient's presentation is most consistent with severe exacerbation of chronic illness.  Patient with long history of migraine headaches, presents with typical migraine headache.  Will treat with IV Benadryl , IV Toradol , IV Reglan , IV fluids and reevaluate  Have asked my colleague to reevaluate after medications given, she is a difficult IV stick        FINAL CLINICAL IMPRESSION(S) / ED DIAGNOSES   Final diagnoses:  Migraine without status migrainosus, not intractable, unspecified migraine type     Rx / DC Orders   ED Discharge Orders     None        Note:  This document was prepared using Dragon voice recognition software and may include unintentional dictation errors.   Arlander Charleston, MD 07/18/24 [phone]

## 2024-07-18 NOTE — ED Triage Notes (Addendum)
 Pt arrives via POV from home for a migraine that has been going on since 0900 this AM. Pt started throwing up around 1200. Pt aching all over her body. Pt has strong sensitivity to light and sound. Pt was dizzy last night but it went away today. Pt took two extra strength Excedrin pills and one rizatriptan  pill today

## 2024-08-02 ENCOUNTER — Ambulatory Visit: Admitting: Podiatry

## 2024-08-02 ENCOUNTER — Encounter: Payer: Self-pay | Admitting: Podiatry

## 2024-08-02 VITALS — Ht 61.0 in | Wt 276.0 lb

## 2024-08-02 DIAGNOSIS — M7751 Other enthesopathy of right foot: Secondary | ICD-10-CM

## 2024-08-02 DIAGNOSIS — M65971 Unspecified synovitis and tenosynovitis, right ankle and foot: Secondary | ICD-10-CM

## 2024-08-02 MED ORDER — BETAMETHASONE SOD PHOS & ACET 6 (3-3) MG/ML IJ SUSP
3.0000 mg | Freq: Once | INTRAMUSCULAR | Status: AC
  Administered 2024-08-02: 3 mg via INTRA_ARTICULAR
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
## 2024-08-08 ENCOUNTER — Encounter: Payer: Self-pay | Admitting: Podiatry

## 2024-08-29 ENCOUNTER — Other Ambulatory Visit: Admitting: Urology

## 2024-10-07 ENCOUNTER — Ambulatory Visit
Admission: EM | Admit: 2024-10-07 | Discharge: 2024-10-07 | Disposition: A | Discharge status: Home / Self Care | Attending: Family Medicine | Admitting: Family Medicine

## 2024-10-07 DIAGNOSIS — N3001 Acute cystitis with hematuria: Secondary | ICD-10-CM

## 2024-10-07 DIAGNOSIS — R300 Dysuria: Secondary | ICD-10-CM | POA: Diagnosis not present

## 2024-10-07 DIAGNOSIS — R21 Rash and other nonspecific skin eruption: Secondary | ICD-10-CM

## 2024-10-07 LAB — POCT URINE PREGNANCY: Preg Test, Ur: NEGATIVE

## 2024-10-07 LAB — POCT URINE DIPSTICK
Bilirubin, UA: NEGATIVE
Glucose, UA: NEGATIVE mg/dL
Ketones, POC UA: NEGATIVE mg/dL
Nitrite, UA: POSITIVE — AB
Protein Ur, POC: 100 mg/dL — AB
Spec Grav, UA: 1.02 (ref 1.010–1.025)
Urobilinogen, UA: 0.2 U/dL
pH, UA: 7 (ref 5.0–8.0)

## 2024-10-07 MED ORDER — CLOTRIMAZOLE-BETAMETHASONE 1-0.05 % EX CREA: TOPICAL_CREAM | CUTANEOUS | 0 refills | Status: AC

## 2024-10-07 MED ORDER — NITROFURANTOIN MONOHYD MACRO 100 MG PO CAPS: 100.0000 mg | ORAL_CAPSULE | Freq: Two times a day (BID) | ORAL | 0 refills | Status: AC

## 2024-10-07 NOTE — ED Triage Notes (Signed)
 UTI  Never had one but thinks it may be one  Two weeks now, it has been getting better still has it  Has some pain but unsure if it is gas related Sx are burning, painful, frequently urination, back pain   Call PCP and they sent her antibiotics- sulfamethoxazole-trimethoprim Taking AZO as well    Left shoulder - has a spot  Has used OTC creams for the area Unsure how she got that  Itchiness
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
## 2024-10-07 NOTE — Discharge Instructions (Addendum)
 Start Macrobid  twice daily for 7 days.  Stop Bactrim.  The clinic will contact you with results of the urine culture done today if positive.  Lots of fluids.  You may start Lotrisone  topical cream twice daily to the rash area as needed.  Please follow-up with your PCP if your symptoms do not improve.  Please go to the ER for any worsening symptoms.  Hope you feel better soon!

## 2024-10-09 LAB — URINE CULTURE: Culture: 40000 — AB

## 2024-10-10 ENCOUNTER — Ambulatory Visit: Payer: Self-pay | Admitting: Nurse Practitioner

## 2024-11-18 ENCOUNTER — Encounter: Payer: Self-pay | Admitting: Podiatry

## 2024-11-22 ENCOUNTER — Ambulatory Visit: Admitting: Podiatry

## 2024-11-22 ENCOUNTER — Ambulatory Visit

## 2024-11-22 ENCOUNTER — Encounter: Payer: Self-pay | Admitting: Podiatry

## 2024-11-22 VITALS — Ht 61.0 in | Wt 276.0 lb

## 2024-11-22 DIAGNOSIS — M7752 Other enthesopathy of left foot: Secondary | ICD-10-CM | POA: Diagnosis not present

## 2024-11-22 DIAGNOSIS — M7662 Achilles tendinitis, left leg: Secondary | ICD-10-CM

## 2024-11-22 DIAGNOSIS — G5791 Unspecified mononeuropathy of right lower limb: Secondary | ICD-10-CM

## 2024-11-22 DIAGNOSIS — M7751 Other enthesopathy of right foot: Secondary | ICD-10-CM

## 2024-11-22 MED ORDER — BETAMETHASONE SOD PHOS & ACET 6 (3-3) MG/ML IJ SUSP
3.0000 mg | Freq: Once | INTRAMUSCULAR | Status: AC
  Administered 2024-11-22: 3 mg via INTRA_ARTICULAR

## 2024-11-22 MED ORDER — MELOXICAM 15 MG PO TABS: 15.0000 mg | ORAL_TABLET | Freq: Every day | ORAL | 1 refills | Status: AC | PRN
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
